# Patient Record
Sex: Female | Born: 1975 | Race: White | Hispanic: Yes | Marital: Single | Smoking: Never smoker
Health system: Southern US, Community
[De-identification: ages and names within clinical notes are randomized; demographics above are authoritative.]

## PROBLEM LIST (undated history)

## (undated) DIAGNOSIS — J45909 Unspecified asthma, uncomplicated: Secondary | ICD-10-CM

---

## 1998-01-24 ENCOUNTER — Emergency Department (HOSPITAL_COMMUNITY): Admission: EM | Admit: 1998-01-24 | Discharge: 1998-01-24 | Payer: Self-pay | Admitting: Emergency Medicine

## 1998-05-06 ENCOUNTER — Encounter: Payer: Self-pay | Admitting: Emergency Medicine

## 1998-05-06 ENCOUNTER — Emergency Department (HOSPITAL_COMMUNITY): Admission: EM | Admit: 1998-05-06 | Discharge: 1998-05-06 | Payer: Self-pay | Admitting: Emergency Medicine

## 1998-10-16 ENCOUNTER — Emergency Department (HOSPITAL_COMMUNITY): Admission: EM | Admit: 1998-10-16 | Discharge: 1998-10-16 | Payer: Self-pay | Admitting: Emergency Medicine

## 1998-10-16 ENCOUNTER — Inpatient Hospital Stay (HOSPITAL_COMMUNITY): Admission: AD | Admit: 1998-10-16 | Discharge: 1998-10-16 | Payer: Self-pay | Admitting: *Deleted

## 1998-11-08 ENCOUNTER — Emergency Department (HOSPITAL_COMMUNITY): Admission: EM | Admit: 1998-11-08 | Discharge: 1998-11-08 | Payer: Self-pay | Admitting: Emergency Medicine

## 1998-11-28 ENCOUNTER — Emergency Department (HOSPITAL_COMMUNITY): Admission: EM | Admit: 1998-11-28 | Discharge: 1998-11-29 | Payer: Self-pay | Admitting: Emergency Medicine

## 1999-04-05 ENCOUNTER — Inpatient Hospital Stay (HOSPITAL_COMMUNITY): Admission: AD | Admit: 1999-04-05 | Discharge: 1999-04-05 | Payer: Self-pay | Admitting: Obstetrics

## 1999-06-08 ENCOUNTER — Emergency Department (HOSPITAL_COMMUNITY): Admission: EM | Admit: 1999-06-08 | Discharge: 1999-06-08 | Payer: Self-pay

## 1999-10-16 ENCOUNTER — Inpatient Hospital Stay (HOSPITAL_COMMUNITY): Admission: AD | Admit: 1999-10-16 | Discharge: 1999-10-16 | Payer: Self-pay | Admitting: Obstetrics

## 1999-10-17 ENCOUNTER — Other Ambulatory Visit: Admission: RE | Admit: 1999-10-17 | Discharge: 1999-10-17 | Payer: Self-pay | Admitting: Obstetrics and Gynecology

## 1999-11-15 ENCOUNTER — Encounter: Payer: Self-pay | Admitting: Obstetrics and Gynecology

## 1999-11-15 ENCOUNTER — Inpatient Hospital Stay (HOSPITAL_COMMUNITY): Admission: AD | Admit: 1999-11-15 | Discharge: 1999-11-15 | Payer: Self-pay | Admitting: Obstetrics and Gynecology

## 2000-01-21 ENCOUNTER — Inpatient Hospital Stay (HOSPITAL_COMMUNITY): Admission: AD | Admit: 2000-01-21 | Discharge: 2000-01-21 | Payer: Self-pay | Admitting: Obstetrics & Gynecology

## 2000-03-03 ENCOUNTER — Inpatient Hospital Stay (HOSPITAL_COMMUNITY): Admission: AD | Admit: 2000-03-03 | Discharge: 2000-03-03 | Payer: Self-pay | Admitting: Obstetrics and Gynecology

## 2000-04-10 ENCOUNTER — Observation Stay (HOSPITAL_COMMUNITY): Admission: AD | Admit: 2000-04-10 | Discharge: 2000-04-11 | Payer: Self-pay | Admitting: Obstetrics and Gynecology

## 2000-04-13 ENCOUNTER — Observation Stay (HOSPITAL_COMMUNITY): Admission: AD | Admit: 2000-04-13 | Discharge: 2000-04-13 | Payer: Self-pay | Admitting: Obstetrics and Gynecology

## 2000-05-08 ENCOUNTER — Inpatient Hospital Stay: Admission: AD | Admit: 2000-05-08 | Discharge: 2000-05-08 | Payer: Self-pay | Admitting: Obstetrics & Gynecology

## 2000-06-17 ENCOUNTER — Inpatient Hospital Stay (HOSPITAL_COMMUNITY): Admission: AD | Admit: 2000-06-17 | Discharge: 2000-06-21 | Payer: Self-pay | Admitting: Obstetrics & Gynecology

## 2000-06-27 ENCOUNTER — Inpatient Hospital Stay (HOSPITAL_COMMUNITY): Admission: AD | Admit: 2000-06-27 | Discharge: 2000-06-30 | Payer: Self-pay | Admitting: Obstetrics and Gynecology

## 2000-09-13 ENCOUNTER — Emergency Department (HOSPITAL_COMMUNITY): Admission: EM | Admit: 2000-09-13 | Discharge: 2000-09-13 | Payer: Self-pay | Admitting: Emergency Medicine

## 2000-09-19 ENCOUNTER — Emergency Department (HOSPITAL_COMMUNITY): Admission: EM | Admit: 2000-09-19 | Discharge: 2000-09-20 | Payer: Self-pay | Admitting: Emergency Medicine

## 2001-02-14 ENCOUNTER — Inpatient Hospital Stay (HOSPITAL_COMMUNITY): Admission: AD | Admit: 2001-02-14 | Discharge: 2001-02-14 | Payer: Self-pay | Admitting: Obstetrics & Gynecology

## 2001-02-18 ENCOUNTER — Encounter: Payer: Self-pay | Admitting: *Deleted

## 2001-02-18 ENCOUNTER — Inpatient Hospital Stay (HOSPITAL_COMMUNITY): Admission: AD | Admit: 2001-02-18 | Discharge: 2001-02-18 | Payer: Self-pay | Admitting: *Deleted

## 2001-03-10 ENCOUNTER — Inpatient Hospital Stay (HOSPITAL_COMMUNITY): Admission: AD | Admit: 2001-03-10 | Discharge: 2001-03-10 | Payer: Self-pay | Admitting: Obstetrics

## 2001-03-17 ENCOUNTER — Inpatient Hospital Stay (HOSPITAL_COMMUNITY): Admission: AD | Admit: 2001-03-17 | Discharge: 2001-03-17 | Payer: Self-pay | Admitting: Obstetrics

## 2001-04-20 ENCOUNTER — Emergency Department (HOSPITAL_COMMUNITY): Admission: EM | Admit: 2001-04-20 | Discharge: 2001-04-21 | Payer: Self-pay | Admitting: Emergency Medicine

## 2001-05-23 ENCOUNTER — Emergency Department (HOSPITAL_COMMUNITY): Admission: EM | Admit: 2001-05-23 | Discharge: 2001-05-23 | Payer: Self-pay | Admitting: Emergency Medicine

## 2001-05-23 ENCOUNTER — Encounter: Payer: Self-pay | Admitting: Emergency Medicine

## 2001-08-28 ENCOUNTER — Emergency Department (HOSPITAL_COMMUNITY): Admission: EM | Admit: 2001-08-28 | Discharge: 2001-08-28 | Payer: Self-pay | Admitting: Emergency Medicine

## 2002-01-06 ENCOUNTER — Emergency Department (HOSPITAL_COMMUNITY): Admission: EM | Admit: 2002-01-06 | Discharge: 2002-01-06 | Payer: Self-pay | Admitting: Emergency Medicine

## 2002-03-14 ENCOUNTER — Inpatient Hospital Stay (HOSPITAL_COMMUNITY): Admission: AD | Admit: 2002-03-14 | Discharge: 2002-03-14 | Payer: Self-pay | Admitting: *Deleted

## 2002-05-14 ENCOUNTER — Ambulatory Visit (HOSPITAL_COMMUNITY): Admission: RE | Admit: 2002-05-14 | Discharge: 2002-05-14 | Payer: Self-pay | Admitting: *Deleted

## 2002-08-21 ENCOUNTER — Inpatient Hospital Stay (HOSPITAL_COMMUNITY): Admission: AD | Admit: 2002-08-21 | Discharge: 2002-08-21 | Payer: Self-pay | Admitting: *Deleted

## 2002-12-18 ENCOUNTER — Emergency Department (HOSPITAL_COMMUNITY): Admission: EM | Admit: 2002-12-18 | Discharge: 2002-12-18 | Payer: Self-pay | Admitting: Emergency Medicine

## 2002-12-18 ENCOUNTER — Encounter: Payer: Self-pay | Admitting: Emergency Medicine

## 2003-07-30 ENCOUNTER — Other Ambulatory Visit: Admission: RE | Admit: 2003-07-30 | Discharge: 2003-07-30 | Payer: Self-pay | Admitting: Obstetrics and Gynecology

## 2005-06-08 ENCOUNTER — Emergency Department (HOSPITAL_COMMUNITY): Admission: EM | Admit: 2005-06-08 | Discharge: 2005-06-08 | Payer: Self-pay | Admitting: Family Medicine

## 2012-08-19 ENCOUNTER — Encounter (HOSPITAL_COMMUNITY): Payer: Self-pay | Admitting: Emergency Medicine

## 2012-08-19 ENCOUNTER — Emergency Department (HOSPITAL_COMMUNITY): Payer: Medicaid Other

## 2012-08-19 ENCOUNTER — Emergency Department (HOSPITAL_COMMUNITY)
Admission: EM | Admit: 2012-08-19 | Discharge: 2012-08-20 | Disposition: A | Discharge status: Home / Self Care | Payer: Medicaid Other | Attending: Emergency Medicine | Admitting: Emergency Medicine

## 2012-08-19 DIAGNOSIS — Z79899 Other long term (current) drug therapy: Secondary | ICD-10-CM | POA: Insufficient documentation

## 2012-08-19 DIAGNOSIS — R0982 Postnasal drip: Secondary | ICD-10-CM | POA: Insufficient documentation

## 2012-08-19 DIAGNOSIS — J029 Acute pharyngitis, unspecified: Secondary | ICD-10-CM | POA: Insufficient documentation

## 2012-08-19 DIAGNOSIS — J3489 Other specified disorders of nose and nasal sinuses: Secondary | ICD-10-CM | POA: Insufficient documentation

## 2012-08-19 DIAGNOSIS — J069 Acute upper respiratory infection, unspecified: Secondary | ICD-10-CM | POA: Insufficient documentation

## 2012-08-19 DIAGNOSIS — R0602 Shortness of breath: Secondary | ICD-10-CM | POA: Insufficient documentation

## 2012-08-19 DIAGNOSIS — J45901 Unspecified asthma with (acute) exacerbation: Secondary | ICD-10-CM | POA: Insufficient documentation

## 2012-08-19 HISTORY — DX: Unspecified asthma, uncomplicated: J45.909

## 2012-08-19 MED ORDER — IPRATROPIUM BROMIDE 0.02 % IN SOLN
0.5000 mg | Freq: Once | RESPIRATORY_TRACT | Status: AC
  Administered 2012-08-19: 0.5 mg via RESPIRATORY_TRACT
  Filled 2012-08-19: qty 2.5

## 2012-08-19 MED ORDER — ALBUTEROL SULFATE (5 MG/ML) 0.5% IN NEBU
5.0000 mg | INHALATION_SOLUTION | Freq: Once | RESPIRATORY_TRACT | Status: AC
  Administered 2012-08-19: 5 mg via RESPIRATORY_TRACT
  Filled 2012-08-19: qty 1

## 2012-08-19 MED ORDER — PREDNISONE 20 MG PO TABS
60.0000 mg | ORAL_TABLET | Freq: Once | ORAL | Status: AC
  Administered 2012-08-19: 60 mg via ORAL
  Filled 2012-08-19: qty 3

## 2012-08-19 NOTE — ED Notes (Signed)
Pt alert, arrives from home, c/o asthma, onset over past several days, developing URI, resp even unlabored, skin pwd, mild exp wheeze noted

## 2012-08-19 NOTE — ED Notes (Signed)
Pt ambulatory to exam room with steady gait. Pt states she has had a productive cough with green sputum. Pt also states she has ear and throat pain when she breathes. Pt with no acute distress. States she last used her inhaler last night.

## 2012-08-19 NOTE — ED Provider Notes (Signed)
History    This chart was scribed for non-physician practitioner working with Rachel Munch, MD by [name], ED Scribe. This patient was seen in room WTR7/WTR7 and the patient's care was started at 10:18PM.   CSN: [id number]  Arrival date & time 08/19/12  2013   First MD Initiated Contact with Patient 08/19/12 2218      Chief Complaint  Patient presents with  . Asthma    (Consider location/radiation/quality/duration/timing/severity/associated sxs/prior treatment) The history is provided by the patient. No language interpreter was used.    Rachel Soto is a 37 y.o. female , with a hx of pneumonia (last episode occurred in 2001) and asthma, who presents to the Emergency Department with a chief complaint of asthma complaining of gradual, progressively worsening, asthma, onset three days ago (08/16/12)  Associated symptoms include sinus pain/pressure and wheezing. The pt reports she has been experiencing difficulty breathing and chest pressure, similar towards that experienced with prior asthma attacks, for the past three days. The pt has taken robitussin cough syrup and applied her albuterol inhaler, however, she informs neither of which provide relief of her asthma like symptoms.   The pt denies any other medical problems at this point and time.   The pt does not smoke or drink alcohol.   Pt does not have a PCP.     Past Medical History  Diagnosis Date  . Asthma     Past Surgical History  Procedure Laterality Date  . Cesarean section      No family history on file.  History  Substance Use Topics  . Smoking status: Never Smoker   . Smokeless tobacco: Not on file  . Alcohol Use: No    OB History   Grav Para Term Preterm Abortions TAB SAB Ect Mult Living                  Review of Systems  Constitutional: Negative for fever, chills, diaphoresis, appetite change, fatigue and unexpected weight change.  HENT: Positive for congestion, sore throat, rhinorrhea, postnasal  drip and sinus pressure. Negative for ear pain, mouth sores, neck stiffness and ear discharge.   Eyes: Negative for visual disturbance.  Respiratory: Positive for cough, chest tightness and wheezing. Negative for shortness of breath and stridor.   Cardiovascular: Negative for chest pain, palpitations and leg swelling.  Gastrointestinal: Negative for nausea, vomiting, abdominal pain, diarrhea and constipation.  Endocrine: Negative for polydipsia, polyphagia and polyuria.  Genitourinary: Negative for dysuria, urgency, frequency and hematuria.  Musculoskeletal: Negative for myalgias, back pain and arthralgias.  Skin: Negative for rash.  Allergic/Immunologic: Negative for immunocompromised state.  Neurological: Negative for syncope, light-headedness, numbness and headaches.  Hematological: Negative for adenopathy. Does not bruise/bleed easily.  Psychiatric/Behavioral: Negative for sleep disturbance. The patient is not nervous/anxious.   All other systems reviewed and are negative.    Allergies  Review of patient's allergies indicates no known allergies.  Home Medications   Current Outpatient Rx  Name  Route  Sig  Dispense  Refill  . albuterol (PROVENTIL HFA;VENTOLIN HFA) 108 (90 BASE) MCG/ACT inhaler   Inhalation   Inhale 2 puffs into the lungs every 6 (six) hours as needed for wheezing.         Marland Kitchen guaiFENesin (ROBITUSSIN) 100 MG/5ML SOLN   Oral   Take 30 mLs by mouth every 6 (six) hours as needed (for cough.).         Marland Kitchen azithromycin (ZITHROMAX) 250 MG tablet   Oral   Take 1  tablet (250 mg total) by mouth daily. Take first 2 tablets together, then 1 every day until finished.   6 tablet   0   . guaiFENesin (MUCINEX) 600 MG 12 hr tablet   Oral   Take 2 tablets (1,200 mg total) by mouth 2 (two) times daily.   20 tablet   0   . HYDROcodone-homatropine (HYCODAN) 5-1.5 MG/5ML syrup   Oral   Take 5 mLs by mouth every 6 (six) hours as needed for cough.   120 mL   0   .  ipratropium (ATROVENT) 0.03 % nasal spray   Nasal   Place 2 sprays into the nose 2 (two) times daily. PRN congestion   30 mL   0   . predniSONE (DELTASONE) 20 MG tablet   Oral   Take 2 tablets (40 mg total) by mouth daily.   10 tablet   0     BP 124/72  Pulse 83  Temp(Src) 98 F (36.7 C) (Oral)  Resp 16  Wt 265 lb (120.203 kg)  SpO2 100%  LMP 08/08/2012  Physical Exam  Nursing note and vitals reviewed. Constitutional: She is oriented to person, place, and time. She appears well-developed and well-nourished. No distress.  HENT:  Head: Normocephalic and atraumatic.  Right Ear: Tympanic membrane, external ear and ear canal normal.  Left Ear: Tympanic membrane, external ear and ear canal normal.  Nose: Mucosal edema and rhinorrhea present. No epistaxis. Right sinus exhibits no maxillary sinus tenderness and no frontal sinus tenderness. Left sinus exhibits no maxillary sinus tenderness and no frontal sinus tenderness.  Mouth/Throat: Uvula is midline, oropharynx is clear and moist and mucous membranes are normal. Mucous membranes are not pale and not cyanotic. No oropharyngeal exudate, posterior oropharyngeal edema, posterior oropharyngeal erythema or tonsillar abscesses.  Eyes: Conjunctivae and EOM are normal. Pupils are equal, round, and reactive to light. No scleral icterus.  Neck: Normal range of motion and full passive range of motion without pain. Neck supple.  Cardiovascular: Normal rate, regular rhythm, normal heart sounds and intact distal pulses.  Exam reveals no gallop and no friction rub.   No murmur heard. Pulmonary/Chest: Effort normal and breath sounds normal. No stridor. No respiratory distress. She has no wheezes. She has no rales.  Lung sounds diminished throughout.   Abdominal: Soft. Bowel sounds are normal. She exhibits no mass. There is no tenderness. There is no rebound and no guarding.  Musculoskeletal: Normal range of motion. She exhibits no edema and no  tenderness.  Lymphadenopathy:    She has no cervical adenopathy.  Neurological: She is alert and oriented to person, place, and time. She exhibits normal muscle tone. Coordination normal.  Speech is clear and goal oriented Moves extremities without ataxia  Skin: Skin is warm and dry. No rash noted. She is not diaphoretic. No erythema.  Psychiatric: She has a normal mood and affect.    ED Course  Procedures (including critical care time)  DIAGNOSTIC STUDIES: Oxygen Saturation is 100% on room air, normal by my interpretation.    COORDINATION OF CARE:   11:07 PM- Treatment plan concerning application of breathing treatment discussed with patient. Pt agrees with treatment.     Labs Reviewed - No data to display  No results found for this or any previous visit. Dg Chest 2 View  08/19/2012  *RADIOLOGY REPORT*  Clinical Data: Shortness of breath  CHEST - 2 VIEW  Comparison: None  Findings: The cardiomediastinal silhouette is unremarkable. The lungs are clear.  There is no evidence of focal airspace disease, pulmonary edema, suspicious pulmonary nodule/mass, pleural effusion, or pneumothorax. No acute bony abnormalities are identified.  IMPRESSION: No evidence of acute cardiopulmonary disease.   Original Report Authenticated By: Harmon Pier, M.D.       1. URI (upper respiratory infection)   2. Asthma exacerbation       MDM  Afua Hoots presents with URI symptoms and asthma exacerbation.  Pt CXR negative for acute infiltrate. Patients symptoms are consistent with URI, likely viral etiology. Discussed that antibiotics are not indicated for viral infections. Pt also with asthma exacerbation.  Patient ambulated in ED with O2 saturations maintained >90, no current signs of respiratory distress. Lung exam improved after nebulizer treatment. Prednisone given in the ED and pt will bd dc with 5 day burst. Pt states they are breathing at baseline. Pt has been instructed to continue using  prescribed medications.  Pt will be discharged with symptomatic treatment.  Verbalizes understanding and is agreeable with plan. Pt is hemodynamically stable & in NAD prior to dc.   1. Medications: atrovent NS, mucinex, hycodan, azithromycin, prednisone, usual home medications 2. Treatment: rest, drink plenty of fluids, take tylenol or ibuprofen for fever control 3. Follow Up: Please followup with your primary doctor for discussion of your diagnoses and further evaluation after today's visit; if you do not have a primary care doctor use the resource guide provided to find one;    I personally performed the services described in this documentation, which was scribed in my presence. The recorded information has been reviewed and is accurate.   Dahlia Client Davontay Watlington, PA-C 08/20/12 0009

## 2012-08-20 MED ORDER — ALBUTEROL SULFATE HFA 108 (90 BASE) MCG/ACT IN AERS: 2.0000 | INHALATION_SPRAY | RESPIRATORY_TRACT | Status: DC | PRN

## 2012-08-20 MED ORDER — IPRATROPIUM BROMIDE 0.03 % NA SOLN: 2.0000 | Freq: Two times a day (BID) | NASAL | Status: DC

## 2012-08-20 MED ORDER — HYDROCODONE-HOMATROPINE 5-1.5 MG/5ML PO SYRP: 5.0000 mL | ORAL_SOLUTION | Freq: Four times a day (QID) | ORAL | Status: DC | PRN

## 2012-08-20 MED ORDER — AZITHROMYCIN 250 MG PO TABS: 250.0000 mg | ORAL_TABLET | Freq: Every day | ORAL | Status: DC

## 2012-08-20 MED ORDER — PREDNISONE 20 MG PO TABS: 40.0000 mg | ORAL_TABLET | Freq: Every day | ORAL | Status: DC

## 2012-08-20 MED ORDER — GUAIFENESIN ER 600 MG PO TB12: 1200.0000 mg | ORAL_TABLET | Freq: Two times a day (BID) | ORAL | Status: DC

## 2012-08-20 NOTE — ED Provider Notes (Signed)
  Medical screening examination/treatment/procedure(s) were performed by non-physician practitioner and as supervising physician I was immediately available for consultation/collaboration.    [name], MD 08/20/12 [phone]

## 2013-10-26 ENCOUNTER — Emergency Department (HOSPITAL_COMMUNITY): Payer: BC Managed Care – PPO

## 2013-10-26 ENCOUNTER — Emergency Department (HOSPITAL_COMMUNITY)
Admission: EM | Admit: 2013-10-26 | Discharge: 2013-10-27 | Disposition: A | Discharge status: Home / Self Care | Payer: BC Managed Care – PPO | Attending: Emergency Medicine | Admitting: Emergency Medicine

## 2013-10-26 ENCOUNTER — Encounter (HOSPITAL_COMMUNITY): Payer: Self-pay | Admitting: Emergency Medicine

## 2013-10-26 DIAGNOSIS — R0789 Other chest pain: Secondary | ICD-10-CM | POA: Insufficient documentation

## 2013-10-26 DIAGNOSIS — R079 Chest pain, unspecified: Secondary | ICD-10-CM

## 2013-10-26 DIAGNOSIS — J45909 Unspecified asthma, uncomplicated: Secondary | ICD-10-CM | POA: Insufficient documentation

## 2013-10-26 LAB — I-STAT TROPONIN, ED: TROPONIN I, POC: 0 ng/mL (ref 0.00–0.08)

## 2013-10-26 MED ORDER — IBUPROFEN 600 MG PO TABS: 600.0000 mg | ORAL_TABLET | Freq: Three times a day (TID) | ORAL | Status: AC

## 2013-10-26 MED ORDER — KETOROLAC TROMETHAMINE 30 MG/ML IJ SOLN
30.0000 mg | Freq: Once | INTRAMUSCULAR | Status: AC
  Administered 2013-10-26: 30 mg via INTRAVENOUS
  Filled 2013-10-26: qty 1

## 2013-10-26 NOTE — ED Provider Notes (Signed)
CSN: [id number]     Arrival date & time 10/26/13  2028 History   First MD Initiated Contact with Patient 10/26/13 2213     Chief Complaint  Patient presents with  . Chest Pain      HPI  Patient presents with ongoing chest pain.  Pain began several months ago, has been inconsistent, waxing/waning.  There are no clear precipitating or relieving, or exacerbating factors. Today the patient a more severe episode of substernal discomfort, described as soreness. She is here for evaluation. Patient has not taken any medication for pain relief. She denies concurrent dyspnea, lightheadedness, syncope, nausea, vomiting, fever, chills. Patient does not smoke, does not drink, has no travel history. The patient works in a [employer], [employer], and moves tires regularly.  Past Medical History  Diagnosis Date  . Asthma    Past Surgical History  Procedure Laterality Date  . Cesarean section     History reviewed. No pertinent family history. History  Substance Use Topics  . Smoking status: Never Smoker   . Smokeless tobacco: Not on file  . Alcohol Use: No   OB History   Grav Para Term Preterm Abortions TAB SAB Ect Mult Living                 Review of Systems  Constitutional:       Per HPI, otherwise negative  HENT:       Per HPI, otherwise negative  Respiratory:       Per HPI, otherwise negative  Cardiovascular:       Per HPI, otherwise negative  Gastrointestinal: Negative for vomiting.  Endocrine:       Negative aside from HPI  Genitourinary:       Neg aside from HPI   Musculoskeletal:       Per HPI, otherwise negative  Skin: Negative.   Neurological: Negative for syncope.      Allergies  Review of patient's allergies indicates no known allergies.  Home Medications   Prior to Admission medications   Not on File   BP 128/59  Pulse 88  Temp(Src) 98 F (36.7 C) (Oral)  Resp 16  SpO2 98%  LMP 10/13/2013 Physical Exam  Nursing note and vitals reviewed. Constitutional:  She is oriented to person, place, and time. She appears well-developed and well-nourished. No distress.  HENT:  Head: Normocephalic and atraumatic.  Eyes: Conjunctivae and EOM are normal.  Cardiovascular: Normal rate and regular rhythm.   Pulmonary/Chest: Effort normal and breath sounds normal. No stridor. No respiratory distress.  Abdominal: She exhibits no distension.  Musculoskeletal: She exhibits no edema.  Neurological: She is alert and oriented to person, place, and time. No cranial nerve deficit.  Skin: Skin is warm and dry.  Psychiatric: She has a normal mood and affect.    ED Course  Procedures (including critical care time) Labs Review Labs Reviewed  I-STAT TROPOININ, ED   CXR reviewed   EKG sinus rhythm, 84, nml   On repeat exam the patient is in no distress.  MDM   Female presents with persistent chest pain, worse today. On exam she is awake alert, and in no distress. She is hemodynamically stable, awake, with no evidence of atypical ACS or occult infection. With the persistency of her pain she started on anti-inflammatories, discharged to follow up with primary care.    Gerhard Munchobert Ayssa Bentivegna, MD 10/26/13 [phone]

## 2013-10-26 NOTE — ED Notes (Signed)
Pt reports intermittent cp x 2 months, denies seeing a doctor d/t work.  Pt reports came to the ED tonight d/t cp becoming severe.

## 2013-10-26 NOTE — ED Notes (Signed)
Pt complains of chest pain for two months but was worse today with a headache and sharp pains

## 2013-10-26 NOTE — Discharge Instructions (Signed)
As discussed, your evaluation today has been largely reassuring.  But, it is important that you monitor your condition carefully, and do not hesitate to return to the ED if you develop new, or concerning changes in your condition. ° °Otherwise, please follow-up with your physician for appropriate ongoing care. ° ° °Chest Pain (Nonspecific) °It is often hard to give a specific diagnosis for the cause of chest pain. There is always a chance that your pain could be related to something serious, such as a heart attack or a blood clot in the lungs. You need to follow up with your caregiver for further evaluation. °CAUSES  °· Heartburn. °· Pneumonia or bronchitis. °· Anxiety or stress. °· Inflammation around your heart (pericarditis) or lung (pleuritis or pleurisy). °· A blood clot in the lung. °· A collapsed lung (pneumothorax). It can develop suddenly on its own (spontaneous pneumothorax) or from injury (trauma) to the chest. °· Shingles infection (herpes zoster virus). °The chest wall is composed of bones, muscles, and cartilage. Any of these can be the source of the pain. °· The bones can be bruised by injury. °· The muscles or cartilage can be strained by coughing or overwork. °· The cartilage can be affected by inflammation and become sore (costochondritis). °DIAGNOSIS  °Lab tests or other studies, such as X-rays, electrocardiography, stress testing, or cardiac imaging, may be needed to find the cause of your pain.  °TREATMENT  °· Treatment depends on what may be causing your chest pain. Treatment may include: °· Acid blockers for heartburn. °· Anti-inflammatory medicine. °· Pain medicine for inflammatory conditions. °· Antibiotics if an infection is present. °· You may be advised to change lifestyle habits. This includes stopping smoking and avoiding alcohol, caffeine, and chocolate. °· You may be advised to keep your head raised (elevated) when sleeping. This reduces the chance of acid going backward from your  stomach into your esophagus. °· Most of the time, nonspecific chest pain will improve within 2 to 3 days with rest and mild pain medicine. °HOME CARE INSTRUCTIONS  °· If antibiotics were prescribed, take your antibiotics as directed. Finish them even if you start to feel better. °· For the next few days, avoid physical activities that bring on chest pain. Continue physical activities as directed. °· Do not smoke. °· Avoid drinking alcohol. °· Only take over-the-counter or prescription medicine for pain, discomfort, or fever as directed by your caregiver. °· Follow your caregiver's suggestions for further testing if your chest pain does not go away. °· Keep any follow-up appointments you made. If you do not go to an appointment, you could develop lasting (chronic) problems with pain. If there is any problem keeping an appointment, you must call to reschedule. °SEEK MEDICAL CARE IF:  °· You think you are having problems from the medicine you are taking. Read your medicine instructions carefully. °· Your chest pain does not go away, even after treatment. °· You develop a rash with blisters on your chest. °SEEK IMMEDIATE MEDICAL CARE IF:  °· You have increased chest pain or pain that spreads to your arm, neck, jaw, back, or abdomen. °· You develop shortness of breath, an increasing cough, or you are coughing up blood. °· You have severe back or abdominal pain, feel nauseous, or vomit. °· You develop severe weakness, fainting, or chills. °· You have a fever. °THIS IS AN EMERGENCY. Do not wait to see if the pain will go away. Get medical help at once. Call your local emergency services (  911 in [place] Do not drive yourself to the hospital. °MAKE SURE YOU:  °· Understand these instructions. °· Will watch your condition. °· Will get help right away if you are not doing well or get worse. °Document Released: 03/21/2005 Document Revised: 09/03/2011 Document Reviewed: 01/15/2008 °Exit[employer]® Patient Information ©2014 [employer]  [employer]. ° ° °

## 2016-07-18 ENCOUNTER — Emergency Department (HOSPITAL_COMMUNITY)
Admission: EM | Admit: 2016-07-18 | Discharge: 2016-07-18 | Disposition: A | Discharge status: Home / Self Care | Payer: BLUE CROSS/BLUE SHIELD | Attending: Emergency Medicine | Admitting: Emergency Medicine

## 2016-07-18 ENCOUNTER — Encounter (HOSPITAL_COMMUNITY): Payer: Self-pay

## 2016-07-18 DIAGNOSIS — J45909 Unspecified asthma, uncomplicated: Secondary | ICD-10-CM | POA: Diagnosis not present

## 2016-07-18 DIAGNOSIS — J111 Influenza due to unidentified influenza virus with other respiratory manifestations: Secondary | ICD-10-CM

## 2016-07-18 DIAGNOSIS — R05 Cough: Secondary | ICD-10-CM | POA: Diagnosis present

## 2016-07-18 DIAGNOSIS — R69 Illness, unspecified: Secondary | ICD-10-CM

## 2016-07-18 MED ORDER — OSELTAMIVIR PHOSPHATE 75 MG PO CAPS: 75.0000 mg | ORAL_CAPSULE | Freq: Two times a day (BID) | ORAL | 0 refills | Status: DC

## 2016-07-18 MED ORDER — HYDROCOD POLST-CPM POLST ER 10-8 MG/5ML PO SUER: 5.0000 mL | Freq: Two times a day (BID) | ORAL | 0 refills | Status: DC | PRN

## 2016-07-18 MED ORDER — ALBUTEROL SULFATE HFA 108 (90 BASE) MCG/ACT IN AERS
2.0000 | INHALATION_SPRAY | Freq: Once | RESPIRATORY_TRACT | Status: DC
  Filled 2016-07-18: qty 6.7

## 2016-07-18 NOTE — Discharge Instructions (Signed)
Read the information below.  Use the prescribed medication as directed.  Please discuss all new medications with your pharmacist.  You may return to the Emergency Department at any time for worsening condition or any new symptoms that concern you.   If you develop worsening shortness of breath, uncontrolled wheezing, severe chest pain, or fevers despite using tylenol and/or ibuprofen, return for a recheck.     °

## 2016-07-18 NOTE — ED Provider Notes (Signed)
[facility]-EMERGENCY DEPT Provider Note   CSN: [id number] Arrival date & time: 07/18/16  1509  By signing my name below, I, Teofilo PodMatthew P. Jamison, attest that this documentation has been prepared under the direction and in the presence of Chanse Kagel, [place]. Electronically Signed: Teofilo PodMatthew P. Jamison, ED Scribe. 07/18/2016. 5:46 PM.    History   Chief Complaint Chief Complaint  Patient presents with  . flu symptoms   The history is provided by the patient. No language interpreter was used.   HPI Comments:  Rachel Soto is a 41 y.o. female with PMHx of asthma who presents to the Emergency Department complaining of multiple sudden onset flu symptoms that began last night. Pt complains of associated dizziness, persistent productive cough with yellow sputum, sore throat, fever of 103 at home, back pain, diaphoresis, decreased appetite, and lightheadedness. Pt states that these symptoms began last night when she got home from work. Pt took robitussin, vitamin c, and advil with no relief. Pt got a flu shot this year. Pt denies SOB, abdominal pain.   Past Medical History:  Diagnosis Date  . Asthma     There are no active problems to display for this patient.   Past Surgical History:  Procedure Laterality Date  . CESAREAN SECTION      OB History    No data available       Home Medications    Prior to Admission medications   Medication Sig Start Date End Date Taking? Authorizing Provider  chlorpheniramine-HYDROcodone (TUSSIONEX PENNKINETIC ER) 10-8 MG/5ML SUER Take 5 mLs by mouth every 12 (twelve) hours as needed for cough. 07/18/16   Trixie DredgeEmily Calvin Chura, PA-C  oseltamivir (TAMIFLU) 75 MG capsule Take 1 capsule (75 mg total) by mouth every 12 (twelve) hours. 07/18/16   Trixie DredgeEmily Rosalio Catterton, PA-C    Family History Family History  Problem Relation Age of Onset  . Diabetes Mother   . Fibromyalgia Mother   . Hepatitis C Mother     Social History Social History  Substance Use Topics  . Smoking  status: Never Smoker  . Smokeless tobacco: Never Used  . Alcohol use No     Allergies   Patient has no known allergies.   Review of Systems Review of Systems  Constitutional: Positive for appetite change, diaphoresis and fever.  HENT: Positive for sore throat.   Respiratory: Positive for cough. Negative for shortness of breath.   Gastrointestinal: Negative for abdominal pain.  Neurological: Positive for dizziness and light-headedness.     Physical Exam Updated Vital Signs BP 140/79 (BP Location: Right Arm)   Pulse 99   Temp 98.6 F (37 C) (Oral)   Resp 18   Ht 5\' 3"  (1.6 m)   Wt 128.8 kg   LMP 06/26/2016 (Approximate)   SpO2 98%   BMI 50.31 kg/m   Physical Exam  Constitutional: She appears well-developed and well-nourished. No distress.  HENT:  Head: Normocephalic and atraumatic.  Mouth/Throat: Oropharynx is clear and moist. No oropharyngeal exudate, posterior oropharyngeal edema, posterior oropharyngeal erythema or tonsillar abscesses.  Nasal mucosa erythematous and edematous.   Eyes: Conjunctivae are normal.  Neck: Normal range of motion. Neck supple.  Cardiovascular: Normal rate and regular rhythm.   Pulmonary/Chest: Effort normal and breath sounds normal. No stridor. No respiratory distress. She has no wheezes. She has no rales.  Lymphadenopathy:    She has no cervical adenopathy.  Neurological: She is alert.  Skin: She is not diaphoretic.  Nursing note and vitals reviewed.  ED Treatments / Results  DIAGNOSTIC STUDIES:  Oxygen Saturation is 99% on RA, normal by my interpretation.    COORDINATION OF CARE:  5:43 PM Will order cough medicine, tamiflu, and inhaler. Discussed treatment plan with pt at bedside and pt agreed to plan.   Labs (all labs ordered are listed, but only abnormal results are displayed) Labs Reviewed - No data to display  EKG  EKG Interpretation None       Radiology No results found.  Procedures Procedures (including  critical care time)  Medications Ordered in ED Medications  albuterol (PROVENTIL HFA;VENTOLIN HFA) 108 (90 Base) MCG/ACT inhaler 2 puff (2 puffs Inhalation Not Given 07/18/16 1813)     Initial Impression / Assessment and Plan / ED Course  I have reviewed the triage vital signs and the nursing notes.  Pertinent labs & imaging results that were available during my care of the patient were reviewed by me and considered in my medical decision making (see chart for details).    Afebrile, nontoxic patient with constellation of symptoms suggestive of viral syndrome, likely influenza.  No concerning findings on exam.  Discharged home with supportive care, PCP follow up.  Discussed result, findings, treatment, and follow up  with patient.  Pt given return precautions.  Pt verbalizes understanding and agrees with plan.      Final Clinical Impressions(s) / ED Diagnoses   Final diagnoses:  Influenza-like illness    New Prescriptions Discharge Medication List as of 07/18/2016  5:48 PM    START taking these medications   Details  chlorpheniramine-HYDROcodone (TUSSIONEX PENNKINETIC ER) 10-8 MG/5ML SUER Take 5 mLs by mouth every 12 (twelve) hours as needed for cough., Starting Wed 07/18/2016, Print    oseltamivir (TAMIFLU) 75 MG capsule Take 1 capsule (75 mg total) by mouth every 12 (twelve) hours., Starting Wed 07/18/2016, Print        I personally performed the services described in this documentation, which was scribed in my presence. The recorded information has been reviewed and is accurate.    Trixie Dredge, PA-C 07/18/16 2029    Benjiman Core, MD 07/19/16 Moses Manners

## 2016-07-18 NOTE — ED Triage Notes (Signed)
Patient reports flu-like symptoms since last night. Patient reports fever yesterday of 103.0, coughing, body aches, headache. Patient has been taking OTC meds- Robitussin, Tylenol and Ibuprofen.

## 2017-02-04 ENCOUNTER — Emergency Department (HOSPITAL_COMMUNITY)
Admission: EM | Admit: 2017-02-04 | Discharge: 2017-02-04 | Disposition: A | Discharge status: Home / Self Care | Payer: BLUE CROSS/BLUE SHIELD | Attending: Emergency Medicine | Admitting: Emergency Medicine

## 2017-02-04 ENCOUNTER — Encounter (HOSPITAL_COMMUNITY): Payer: Self-pay | Admitting: Emergency Medicine

## 2017-02-04 DIAGNOSIS — R51 Headache: Secondary | ICD-10-CM | POA: Diagnosis not present

## 2017-02-04 DIAGNOSIS — R110 Nausea: Secondary | ICD-10-CM | POA: Insufficient documentation

## 2017-02-04 LAB — URINALYSIS, ROUTINE W REFLEX MICROSCOPIC
BACTERIA UA: NONE SEEN
BILIRUBIN URINE: NEGATIVE
Glucose, UA: NEGATIVE mg/dL
Hgb urine dipstick: NEGATIVE
Ketones, ur: NEGATIVE mg/dL
Nitrite: NEGATIVE
PROTEIN: NEGATIVE mg/dL
Specific Gravity, Urine: 1.016 (ref 1.005–1.030)
pH: 8 (ref 5.0–8.0)

## 2017-02-04 LAB — POC URINE PREG, ED: Preg Test, Ur: NEGATIVE

## 2017-02-04 MED ORDER — ONDANSETRON 4 MG PO TBDP
4.0000 mg | ORAL_TABLET | Freq: Once | ORAL | Status: AC | PRN
  Administered 2017-02-04: 4 mg via ORAL
  Filled 2017-02-04: qty 1

## 2017-02-04 NOTE — ED Notes (Signed)
No answer when called for room x 3 

## 2017-02-04 NOTE — ED Triage Notes (Signed)
Pt reports headache for the last 4 days along with nausea.

## 2017-05-07 ENCOUNTER — Ambulatory Visit: Payer: Self-pay | Admitting: Physician Assistant

## 2018-01-25 ENCOUNTER — Other Ambulatory Visit: Payer: Self-pay

## 2018-01-25 ENCOUNTER — Encounter (HOSPITAL_COMMUNITY): Payer: Self-pay | Admitting: *Deleted

## 2018-01-25 ENCOUNTER — Emergency Department (HOSPITAL_COMMUNITY)
Admission: EM | Admit: 2018-01-25 | Discharge: 2018-01-25 | Disposition: A | Discharge status: Home / Self Care | Payer: BLUE CROSS/BLUE SHIELD | Attending: Emergency Medicine | Admitting: Emergency Medicine

## 2018-01-25 DIAGNOSIS — Z5321 Procedure and treatment not carried out due to patient leaving prior to being seen by health care provider: Secondary | ICD-10-CM | POA: Diagnosis not present

## 2018-01-25 DIAGNOSIS — M545 Low back pain: Secondary | ICD-10-CM | POA: Insufficient documentation

## 2018-01-25 NOTE — ED Triage Notes (Signed)
Pt reports she was at work went to pick up a tire off the ground and she felt a pain in the left lower back area on the 29. Last took 5 advil for the pain this morning.  She denies numbness in extremities. Ambulatory to waiting area with her family.

## 2018-01-25 NOTE — ED Notes (Signed)
After triage, patient says that she can't go to the lobby because she is unable to sit or lie down for long periods of time. I offered the patient warm packs, cool compresses and a wheelchair. Refused the above. Ambulatory to lobby with her female partner.

## 2018-01-27 NOTE — ED Notes (Signed)
Follow up call made  No answer  01/27/18 0858  s Terrill Wauters rn

## 2019-08-11 ENCOUNTER — Other Ambulatory Visit: Payer: Self-pay

## 2019-08-11 ENCOUNTER — Encounter (HOSPITAL_BASED_OUTPATIENT_CLINIC_OR_DEPARTMENT_OTHER): Payer: Self-pay | Admitting: Emergency Medicine

## 2019-08-11 ENCOUNTER — Emergency Department (HOSPITAL_BASED_OUTPATIENT_CLINIC_OR_DEPARTMENT_OTHER)
Admission: EM | Admit: 2019-08-11 | Discharge: 2019-08-12 | Disposition: A | Discharge status: Home / Self Care | Payer: BC Managed Care – PPO | Attending: Emergency Medicine | Admitting: Emergency Medicine

## 2019-08-11 DIAGNOSIS — R197 Diarrhea, unspecified: Secondary | ICD-10-CM | POA: Insufficient documentation

## 2019-08-11 DIAGNOSIS — Z79899 Other long term (current) drug therapy: Secondary | ICD-10-CM | POA: Diagnosis not present

## 2019-08-11 DIAGNOSIS — R112 Nausea with vomiting, unspecified: Secondary | ICD-10-CM | POA: Diagnosis present

## 2019-08-11 DIAGNOSIS — J45909 Unspecified asthma, uncomplicated: Secondary | ICD-10-CM | POA: Diagnosis not present

## 2019-08-11 LAB — COMPREHENSIVE METABOLIC PANEL
ALT: 20 U/L (ref 0–44)
AST: 23 U/L (ref 15–41)
Albumin: 3.8 g/dL (ref 3.5–5.0)
Alkaline Phosphatase: 83 U/L (ref 38–126)
Anion gap: 9 (ref 5–15)
BUN: 11 mg/dL (ref 6–20)
CO2: 24 mmol/L (ref 22–32)
Calcium: 9.4 mg/dL (ref 8.9–10.3)
Chloride: 105 mmol/L (ref 98–111)
Creatinine, Ser: 0.62 mg/dL (ref 0.44–1.00)
GFR calc Af Amer: >60 mL/min (ref >60)
GFR calc non Af Amer: >60 mL/min (ref >60)
Glucose, Bld: 87 mg/dL (ref 70–99)
Potassium: 4 mmol/L (ref 3.5–5.1)
Sodium: 138 mmol/L (ref 135–145)
Total Bilirubin: 0.3 mg/dL (ref 0.3–1.2)
Total Protein: 7.7 g/dL (ref 6.5–8.1)

## 2019-08-11 LAB — LIPASE, BLOOD: Lipase: 23 U/L (ref 11–51)

## 2019-08-11 LAB — CBC WITH DIFFERENTIAL/PLATELET
Abs Immature Granulocytes: 0.04 10*3/uL (ref 0.00–0.07)
Basophils Absolute: 0.1 10*3/uL (ref 0.0–0.1)
Basophils Relative: 1 %
Eosinophils Absolute: 0.1 10*3/uL (ref 0.0–0.5)
Eosinophils Relative: 1 %
HCT: 37.4 % (ref 36.0–46.0)
Hemoglobin: 12.2 g/dL (ref 12.0–15.0)
Immature Granulocytes: 0 %
Lymphocytes Relative: 26 %
Lymphs Abs: 3 10*3/uL (ref 0.7–4.0)
MCH: 28.3 pg (ref 26.0–34.0)
MCHC: 32.6 g/dL (ref 30.0–36.0)
MCV: 86.8 fL (ref 80.0–100.0)
Monocytes Absolute: 0.8 10*3/uL (ref 0.1–1.0)
Monocytes Relative: 7 %
Neutro Abs: 7.7 10*3/uL (ref 1.7–7.7)
Neutrophils Relative %: 65 %
Platelets: 348 10*3/uL (ref 150–400)
RBC: 4.31 MIL/uL (ref 3.87–5.11)
RDW: 14.4 % (ref 11.5–15.5)
WBC: 11.7 10*3/uL — ABNORMAL HIGH (ref 4.0–10.5)
nRBC: 0 % (ref 0.0–0.2)

## 2019-08-11 LAB — URINALYSIS, MICROSCOPIC (REFLEX)

## 2019-08-11 LAB — URINALYSIS, ROUTINE W REFLEX MICROSCOPIC
Bilirubin Urine: NEGATIVE
Glucose, UA: NEGATIVE mg/dL
Ketones, ur: NEGATIVE mg/dL
Nitrite: NEGATIVE
Protein, ur: NEGATIVE mg/dL
Specific Gravity, Urine: 1.025 (ref 1.005–1.030)
pH: 6 (ref 5.0–8.0)

## 2019-08-11 LAB — PREGNANCY, URINE: Preg Test, Ur: NEGATIVE

## 2019-08-11 MED ORDER — SODIUM CHLORIDE 0.9 % IV BOLUS
1000.0000 mL | Freq: Once | INTRAVENOUS | Status: AC
  Administered 2019-08-11: 23:00:00 1000 mL via INTRAVENOUS

## 2019-08-11 MED ORDER — ONDANSETRON HCL 4 MG/2ML IJ SOLN
4.0000 mg | Freq: Once | INTRAMUSCULAR | Status: AC
  Administered 2019-08-11: 23:00:00 4 mg via INTRAVENOUS
  Filled 2019-08-11: qty 2

## 2019-08-11 NOTE — ED Provider Notes (Signed)
[facility] EMERGENCY DEPARTMENT Provider Note   CSN: [id number] Arrival date & time: 08/11/19  2216     History Chief Complaint  Patient presents with  . Emesis    Rachel Soto is a 44 y.o. female with medical history significant for asthma who presents for evaluation of nausea vomiting and diarrhea.  Patient states she ate at [employer] on Sunday, 2 days PTA.  Patient states within a few hours after eating that she began having diarrhea.  Multiple episodes daily without any melena or bright red blood per rectum.  Patient states she is initially nauseous until yesterday she started with emesis.  Emesis NBNB.  Last episode of emesis approximately 2 PM today.  She has been able to tolerate small sips of liquids and some crackers today without emesis.  She has not take anything for symptoms.  Patient has had some right flank muscle spasms.  States she will have knots develop to her right flank which self resolve and then recur.  No midline back pain, recent injury or trauma.  She denies any urinary symptoms.  No IV drug use, bowel or bladder incontinence, saddle paresthesias.  She denies fever, chills, chest pain, shortness of breath, abdominal pain, pelvic pain, hematuria, history of stones or pyelonephritis.  Denies additional aggravating or alleviating factors.  History obtained from patient and past medical records.  No interpreter is used.  Prior abd surgeries- c-section Last PO 3 pm  HPI     Past Medical History:  Diagnosis Date  . Asthma     There are no problems to display for this patient.   Past Surgical History:  Procedure Laterality Date  . CESAREAN SECTION       OB History   No obstetric history on file.     Family History  Problem Relation Age of Onset  . Diabetes Mother   . Fibromyalgia Mother   . Hepatitis C Mother     Social History   Tobacco Use  . Smoking status: Never Smoker  . Smokeless tobacco: Never Used  Substance Use Topics   . Alcohol use: No  . Drug use: No    Home Medications Prior to Admission medications   Medication Sig Start Date End Date Taking? Authorizing Provider  loratadine (CLARITIN) 10 MG tablet Take 10 mg by mouth daily.   Yes [provider]    Allergies    Patient has no known allergies.  Review of Systems   Review of Systems  Constitutional: Negative.   HENT: Negative.   Respiratory: Negative.   Cardiovascular: Negative.   Gastrointestinal: Positive for diarrhea, nausea and vomiting. Negative for abdominal distention, abdominal pain, anal bleeding, blood in stool, constipation and rectal pain.  Genitourinary: Positive for flank pain. Negative for decreased urine volume, difficulty urinating, dyspareunia, dysuria, enuresis, frequency, genital sores, hematuria, menstrual problem, pelvic pain, urgency, vaginal bleeding, vaginal discharge and vaginal pain.  Skin: Negative.   Neurological: Negative.   All other systems reviewed and are negative.   Physical Exam Updated Vital Signs BP (!) 108/58   Pulse 94   Temp 97.8 F (36.6 C) (Oral)   Resp 16   Ht 5\' 3"  (1.6 m)   Wt (!) 138.8 kg   SpO2 100%   BMI 54.21 kg/m   Physical Exam Vitals and nursing note reviewed.  Constitutional:      General: She is not in acute distress.    Appearance: She is well-developed. She is obese. She is not ill-appearing  or toxic-appearing.  HENT:     Head: Normocephalic and atraumatic.     Nose: Nose normal.     Mouth/Throat:     Mouth: Mucous membranes are moist.     Pharynx: Oropharynx is clear.  Eyes:     Pupils: Pupils are equal, round, and reactive to light.  Cardiovascular:     Rate and Rhythm: Normal rate.     Pulses: Normal pulses.     Heart sounds: Normal heart sounds.  Pulmonary:     Effort: Pulmonary effort is normal. No respiratory distress.     Breath sounds: Normal breath sounds.  Abdominal:     General: Bowel sounds are normal. There is no distension.      Palpations: Abdomen is soft.     Tenderness: There is no abdominal tenderness. There is no right CVA tenderness, left CVA tenderness, guarding or rebound. Negative signs include Murphy's sign.     Hernia: No hernia is present.     Comments: Soft, nontender without rebound or guarding.  No overlying skin changes.  No CVA tap bilaterally.  Musculoskeletal:        General: Normal range of motion.     Cervical back: Normal range of motion.     Comments: No mdline spinal tenderness.  Skin:    General: Skin is warm and dry.     Capillary Refill: Capillary refill takes less than 2 seconds.     Comments: Brisk capillary refill.  No overlying skin changes.  Neurological:     Mental Status: She is alert.     Comments: Ambulatory from restroom to room without difficulty.    ED Results / Procedures / Treatments   Labs (all labs ordered are listed, but only abnormal results are displayed) Labs Reviewed  URINALYSIS, ROUTINE W REFLEX MICROSCOPIC - Abnormal; Notable for the following components:      Result Value   Hgb urine dipstick TRACE (*)    Leukocytes,Ua SMALL (*)    All other components within normal limits  CBC WITH DIFFERENTIAL/PLATELET - Abnormal; Notable for the following components:   WBC 11.7 (*)    All other components within normal limits  URINALYSIS, MICROSCOPIC (REFLEX) - Abnormal; Notable for the following components:   Bacteria, UA RARE (*)    All other components within normal limits  PREGNANCY, URINE  COMPREHENSIVE METABOLIC PANEL  LIPASE, BLOOD    EKG None  Radiology No results found.  Procedures Procedures (including critical care time)  Medications Ordered in ED Medications  sodium chloride 0.9 % bolus 1,000 mL (1,000 mLs Intravenous New Bag/Given 08/11/19 2301)  ondansetron (ZOFRAN) injection 4 mg (4 mg Intravenous Given 08/11/19 2301)    ED Course  I have reviewed the triage vital signs and the nursing notes.  Pertinent labs & imaging results that were  available during my care of the patient were reviewed by me and considered in my medical decision making (see chart for details).  44 year old female appears otherwise well presents for evaluation of nausea vomiting and diarrhea.  She is afebrile, nonseptic, not ill-appearing.  Symptoms began after eating little [place].  She has been able to tolerate small sips of liquids and crackers today.  No melena or bright red blood per rectum.  No emesis.  She denies any NSAID or alcohol use.  Abdomen soft, nontender.  Heart and lungs clear.  Patient also with right-sided muscle spasms.  No prior history of stones.  Patient denies dysuria, hematuria.  Negative CVA tap bilaterally.  She has no red flags for back pain.  Will obtain labs, urine and reevaluate.  Pregnancy test negative UA with trace leuks and rare bacteria. No urinary sx.  Care transferred to Dr. Nicanor Alcon who will follow up on labs. If labs negative and patient able to tolerate PO likely dc home with muscle relaxer for back spasms and zofran for emesis.    MDM Rules/Calculators/A&P                       Final Clinical Impression(s) / ED Diagnoses Final diagnoses:  Nausea vomiting and diarrhea    Rx / DC Orders ED Discharge Orders    None       Demica Zook A, PA-C 08/11/19 2310    Terald Sleeper, MD 08/12/19 1024

## 2019-08-11 NOTE — ED Notes (Signed)
ED Provider at bedside. 

## 2019-08-11 NOTE — ED Triage Notes (Signed)
Pt reports having diarrhea 2 days ago and vomiting yesterday. Pt c/o lower back pain today.

## 2020-08-31 DIAGNOSIS — J45909 Unspecified asthma, uncomplicated: Secondary | ICD-10-CM | POA: Insufficient documentation

## 2020-08-31 DIAGNOSIS — R1012 Left upper quadrant pain: Secondary | ICD-10-CM | POA: Insufficient documentation

## 2020-08-31 DIAGNOSIS — R1032 Left lower quadrant pain: Secondary | ICD-10-CM | POA: Insufficient documentation

## 2020-08-31 DIAGNOSIS — R110 Nausea: Secondary | ICD-10-CM | POA: Insufficient documentation

## 2020-09-01 ENCOUNTER — Encounter (HOSPITAL_COMMUNITY): Payer: Self-pay

## 2020-09-01 ENCOUNTER — Emergency Department (HOSPITAL_COMMUNITY): Payer: Self-pay

## 2020-09-01 ENCOUNTER — Emergency Department (HOSPITAL_COMMUNITY)
Admission: EM | Admit: 2020-09-01 | Discharge: 2020-09-01 | Disposition: A | Discharge status: Home / Self Care | Payer: Self-pay | Attending: Emergency Medicine | Admitting: Emergency Medicine

## 2020-09-01 ENCOUNTER — Other Ambulatory Visit: Payer: Self-pay

## 2020-09-01 DIAGNOSIS — R109 Unspecified abdominal pain: Secondary | ICD-10-CM

## 2020-09-01 LAB — CBC WITH DIFFERENTIAL/PLATELET
Abs Immature Granulocytes: 0.08 10*3/uL — ABNORMAL HIGH (ref 0.00–0.07)
Basophils Absolute: 0 10*3/uL (ref 0.0–0.1)
Basophils Relative: 0 %
Eosinophils Absolute: 0.2 10*3/uL (ref 0.0–0.5)
Eosinophils Relative: 3 %
HCT: 36.4 % (ref 36.0–46.0)
Hemoglobin: 11.7 g/dL — ABNORMAL LOW (ref 12.0–15.0)
Immature Granulocytes: 1 %
Lymphocytes Relative: 23 %
Lymphs Abs: 1.8 10*3/uL (ref 0.7–4.0)
MCH: 28.4 pg (ref 26.0–34.0)
MCHC: 32.1 g/dL (ref 30.0–36.0)
MCV: 88.3 fL (ref 80.0–100.0)
Monocytes Absolute: 0.4 10*3/uL (ref 0.1–1.0)
Monocytes Relative: 6 %
Neutro Abs: 5.3 10*3/uL (ref 1.7–7.7)
Neutrophils Relative %: 67 %
Platelets: 274 10*3/uL (ref 150–400)
RBC: 4.12 MIL/uL (ref 3.87–5.11)
RDW: 14.4 % (ref 11.5–15.5)
WBC: 7.9 10*3/uL (ref 4.0–10.5)
nRBC: 0 % (ref 0.0–0.2)

## 2020-09-01 LAB — COMPREHENSIVE METABOLIC PANEL
ALT: 14 U/L (ref 0–44)
AST: 17 U/L (ref 15–41)
Albumin: 3.7 g/dL (ref 3.5–5.0)
Alkaline Phosphatase: 84 U/L (ref 38–126)
Anion gap: 8 (ref 5–15)
BUN: 13 mg/dL (ref 6–20)
CO2: 23 mmol/L (ref 22–32)
Calcium: 8.7 mg/dL — ABNORMAL LOW (ref 8.9–10.3)
Chloride: 104 mmol/L (ref 98–111)
Creatinine, Ser: 0.49 mg/dL (ref 0.44–1.00)
GFR, Estimated: >60 mL/min (ref >60)
Glucose, Bld: 115 mg/dL — ABNORMAL HIGH (ref 70–99)
Potassium: 4.1 mmol/L (ref 3.5–5.1)
Sodium: 135 mmol/L (ref 135–145)
Total Bilirubin: 0.6 mg/dL (ref 0.3–1.2)
Total Protein: 7.2 g/dL (ref 6.5–8.1)

## 2020-09-01 LAB — URINALYSIS, ROUTINE W REFLEX MICROSCOPIC
Bacteria, UA: NONE SEEN
Bilirubin Urine: NEGATIVE
Glucose, UA: NEGATIVE mg/dL
Ketones, ur: NEGATIVE mg/dL
Leukocytes,Ua: NEGATIVE
Nitrite: NEGATIVE
Protein, ur: NEGATIVE mg/dL
Specific Gravity, Urine: 1.014 (ref 1.005–1.030)
pH: 5 (ref 5.0–8.0)

## 2020-09-01 LAB — I-STAT BETA HCG BLOOD, ED (MC, WL, AP ONLY): I-stat hCG, quantitative: <5 m[IU]/mL (ref <5)

## 2020-09-01 LAB — LIPASE, BLOOD: Lipase: 27 U/L (ref 11–51)

## 2020-09-01 MED ORDER — ONDANSETRON 4 MG PO TBDP: 4.0000 mg | ORAL_TABLET | Freq: Three times a day (TID) | ORAL | 0 refills | Status: DC | PRN

## 2020-09-01 MED ORDER — IOHEXOL 300 MG/ML  SOLN
100.0000 mL | Freq: Once | INTRAMUSCULAR | Status: AC | PRN
  Administered 2020-09-01: 100 mL via INTRAVENOUS

## 2020-09-01 MED ORDER — MORPHINE SULFATE (PF) 4 MG/ML IV SOLN
4.0000 mg | Freq: Once | INTRAVENOUS | Status: AC
  Administered 2020-09-01: 4 mg via INTRAVENOUS
  Filled 2020-09-01: qty 1

## 2020-09-01 MED ORDER — LACTATED RINGERS IV BOLUS
1000.0000 mL | Freq: Once | INTRAVENOUS | Status: AC
  Administered 2020-09-01: 1000 mL via INTRAVENOUS

## 2020-09-01 NOTE — ED Triage Notes (Signed)
Pt sts LLQ abdominal pain that has been ongoing since 2/28. Seen at [facility] twice for same. MD there told pt it looked like she passed a kidney stone. Pt represented after no relief and was given a shot of rocephin and something for pain. Pt sts pain is still present.

## 2020-09-01 NOTE — Discharge Instructions (Addendum)
You can take 600 mg of ibuprofen every 6 hours, you can take 1000 mg of Tylenol every 6 hours, you can alternate these every 3 or you can take them together.  

## 2020-09-01 NOTE — ED Notes (Signed)
An After Visit Summary was printed and given to the patient. Discharge instructions given and no further questions at this time.  

## 2020-09-01 NOTE — ED Provider Notes (Signed)
[facility]-EMERGENCY DEPT Provider Note   CSN: [id number] Arrival date & time: 08/31/20  2352     History Chief Complaint  Patient presents with  . Abdominal Pain    Rachel Soto is a 45 y.o. female.   Abdominal Pain Pain location:  LUQ, LLQ and L flank Pain quality: aching   Pain radiates to:  Does not radiate Pain severity:  Moderate Onset quality:  Gradual Timing:  Constant Progression:  Waxing and waning Chronicity:  New Relieved by:  Nothing Worsened by:  Nothing Ineffective treatments:  None tried Associated symptoms: nausea   Associated symptoms: no chest pain, no chills, no constipation, no cough, no diarrhea, no dysuria, no fever, no shortness of breath and no vomiting   Risk factors: obesity        Past Medical History:  Diagnosis Date  . Asthma     There are no problems to display for this patient.   Past Surgical History:  Procedure Laterality Date  . CESAREAN SECTION       OB History   No obstetric history on file.     Family History  Problem Relation Age of Onset  . Diabetes Mother   . Fibromyalgia Mother   . Hepatitis C Mother     Social History   Tobacco Use  . Smoking status: Never Smoker  . Smokeless tobacco: Never Used  Substance Use Topics  . Alcohol use: No  . Drug use: No    Home Medications Prior to Admission medications   Medication Sig Start Date End Date Taking? Authorizing Provider  ondansetron (ZOFRAN ODT) 4 MG disintegrating tablet Take 1 tablet (4 mg total) by mouth every 8 (eight) hours as needed for up to 10 doses for nausea or vomiting. 09/01/20  Yes Sabino Donovan, MD  loratadine (CLARITIN) 10 MG tablet Take 10 mg by mouth daily.    [provider]    Allergies    Patient has no known allergies.  Review of Systems   Review of Systems  Constitutional: Negative for chills and fever.  HENT: Negative for congestion and rhinorrhea.   Respiratory: Negative for cough and  shortness of breath.   Cardiovascular: Negative for chest pain and palpitations.  Gastrointestinal: Positive for abdominal pain and nausea. Negative for constipation, diarrhea and vomiting.  Genitourinary: Positive for flank pain. Negative for difficulty urinating and dysuria.  Musculoskeletal: Negative for arthralgias and back pain.  Skin: Negative for rash and wound.  Neurological: Negative for light-headedness and headaches.    Physical Exam Updated Vital Signs BP 122/67   Pulse 62   Temp 98 F (36.7 C) (Oral)   Resp (!) 22   Ht 5\' 3"  (1.6 m)   Wt 134.3 kg   LMP 09/01/2020 (Approximate) Comment: neg   SpO2 96%   BMI 52.43 kg/m   Physical Exam Vitals and nursing note reviewed. Exam conducted with a chaperone present.  Constitutional:      General: She is not in acute distress.    Appearance: Normal appearance.  HENT:     Head: Normocephalic and atraumatic.     Nose: No rhinorrhea.  Eyes:     General:        Right eye: No discharge.        Left eye: No discharge.     Conjunctiva/sclera: Conjunctivae normal.  Cardiovascular:     Rate and Rhythm: Normal rate and regular rhythm.  Pulmonary:     Effort: Pulmonary effort is normal.  No respiratory distress.     Breath sounds: No stridor.  Abdominal:     General: Abdomen is flat. There is no distension.     Palpations: Abdomen is soft.     Tenderness: There is abdominal tenderness in the left upper quadrant and left lower quadrant. There is no right CVA tenderness, left CVA tenderness, guarding or rebound. Negative signs include Murphy's sign, Rovsing's sign and McBurney's sign.  Musculoskeletal:        General: No tenderness or signs of injury.  Skin:    General: Skin is warm and dry.  Neurological:     General: No focal deficit present.     Mental Status: She is alert. Mental status is at baseline.     Motor: No weakness.  Psychiatric:        Mood and Affect: Mood normal.        Behavior: Behavior normal.     ED  Results / Procedures / Treatments   Labs (all labs ordered are listed, but only abnormal results are displayed) Labs Reviewed  CBC WITH DIFFERENTIAL/PLATELET - Abnormal; Notable for the following components:      Result Value   Hemoglobin 11.7 (*)    Abs Immature Granulocytes 0.08 (*)    All other components within normal limits  COMPREHENSIVE METABOLIC PANEL - Abnormal; Notable for the following components:   Glucose, Bld 115 (*)    Calcium 8.7 (*)    All other components within normal limits  URINALYSIS, ROUTINE W REFLEX MICROSCOPIC - Abnormal; Notable for the following components:   Hgb urine dipstick SMALL (*)    All other components within normal limits  LIPASE, BLOOD  I-STAT BETA HCG BLOOD, ED ([facility], [facility], [facility] ONLY)    EKG None  Radiology CT ABDOMEN PELVIS W CONTRAST  Result Date: 09/01/2020 CLINICAL DATA:  Abdominal pain, primarily left-sided EXAM: CT ABDOMEN AND PELVIS WITH CONTRAST TECHNIQUE: Multidetector CT imaging of the abdomen and pelvis was performed using the standard protocol following bolus administration of intravenous contrast. CONTRAST:  OMNIPAQUE IOHEXOL 300 MG/ML  SOLN COMPARISON:  None. FINDINGS: Lower chest: There is slight bibasilar atelectasis. There is no lung base edema or consolidation. Hepatobiliary: No focal liver lesions are appreciable. Gallbladder wall is not appreciably thickened. There is no biliary duct dilatation. Pancreas: There is no appreciable pancreatic mass or inflammatory focus. Spleen: No splenic lesions are evident. Adrenals/Urinary Tract: Adrenals bilaterally appear normal. There is no evident renal mass or hydronephrosis on either side. There is no appreciable renal or ureteral calculus on either side. Urinary bladder is midline with wall thickness within normal limits. Stomach/Bowel: No evident diverticular disease by CT. No bowel wall or mesenteric thickening. No evident bowel obstruction. Terminal ileum appears normal. There is no free  air or portal venous air. Vascular/Lymphatic: There is no abdominal aortic aneurysm. No arterial vascular lesions are evident. Major venous structures appear patent. There is no appreciable adenopathy in the abdomen or pelvis. Reproductive: Uterus is anteverted. Tampon present. No adnexal mass evident. Other: Appendix appears normal. No abscess or ascites in the abdomen or pelvis. There is slight fat in the umbilicus. Musculoskeletal: No blastic or lytic bone lesions. No intramuscular lesions. IMPRESSION: 1. A cause for patient's symptoms has not been established with this study. 2. No bowel wall thickening or bowel obstruction. No abscess in the abdomen or pelvis. Appendix appears normal. 3. No renal or ureteral calculus. No hydronephrosis. Urinary bladder wall thickness normal. Electronically Signed   By: Chrissie Noa  Margarita Grizzle III M.D.   On: 09/01/2020 12:17    Procedures Ultrasound ED Peripheral IV (Provider)  Date/Time: 09/01/2020 9:15 AM Performed by: Sabino Donovan, MD Authorized by: Sabino Donovan, MD   Procedure details:    Indications: hydration, multiple failed IV attempts and poor IV access     Skin Prep: chlorhexidine gluconate     Location:  Left AC   Angiocath:  20 G   Bedside Ultrasound Guided: Yes     Images: not archived     Patient tolerated procedure without complications: Yes     Dressing applied: Yes       Medications Ordered in ED Medications  lactated ringers bolus 1,000 mL (1,000 mLs Intravenous New Bag/Given 09/01/20 0920)  morphine 4 MG/ML injection 4 mg (4 mg Intravenous Given 09/01/20 0922)  iohexol (OMNIPAQUE) 300 MG/ML solution 100 mL (100 mLs Intravenous Contrast Given 09/01/20 1137)    ED Course  I have reviewed the triage vital signs and the nursing notes.  Pertinent labs & imaging results that were available during my care of the patient were reviewed by me and considered in my medical decision making (see chart for details).    MDM Rules/Calculators/A&P                           Left-sided abdominal pain.  Patient states she has urinary frequency and burning.  Also states when she eats certain foods her belly pain gets worse.  Was seen previously at outside facility had CT scans done twice.  The first 1 showed some peripancreatic stranding and she had an elevated lipase at that time.  Possibly this is related pancreatitis.  She was told this was likely related to possible UTI or having passed renal stones.  Here her vital signs are stable she is afebrile she has a nonperitoneal neck but tender abdomen.  She will get repeat laboratory studies pain control fluids and possibly CT imaging with contrast.  Patient agrees to this plan.  Patient screening laboratory studies are unremarkable.  No signs of acute inflammation improving white blood cell count improving lipase.  Kidney function is normal.  Urinalysis without signs of infection.  I advised her to continue the antibiotic regimen she is currently on for UTI though.  CT scan with contrast reviewed by radiology myself shows no acute abnormality.  Patient safe for outpatient management with return precautions given.  Final Clinical Impression(s) / ED Diagnoses Final diagnoses:  Undifferentiated abdominal pain    Rx / DC Orders ED Discharge Orders         Ordered    ondansetron (ZOFRAN ODT) 4 MG disintegrating tablet  Every 8 hours PRN        09/01/20 1323           Sabino Donovan, MD 09/01/20 1324

## 2020-09-01 NOTE — ED Notes (Signed)
Family at bedside. 

## 2021-02-10 ENCOUNTER — Emergency Department (HOSPITAL_BASED_OUTPATIENT_CLINIC_OR_DEPARTMENT_OTHER): Payer: Self-pay

## 2021-02-10 ENCOUNTER — Other Ambulatory Visit: Payer: Self-pay

## 2021-02-10 ENCOUNTER — Encounter (HOSPITAL_BASED_OUTPATIENT_CLINIC_OR_DEPARTMENT_OTHER): Payer: Self-pay | Admitting: *Deleted

## 2021-02-10 DIAGNOSIS — Z20822 Contact with and (suspected) exposure to covid-19: Secondary | ICD-10-CM | POA: Insufficient documentation

## 2021-02-10 DIAGNOSIS — R6883 Chills (without fever): Secondary | ICD-10-CM | POA: Insufficient documentation

## 2021-02-10 DIAGNOSIS — J45909 Unspecified asthma, uncomplicated: Secondary | ICD-10-CM | POA: Insufficient documentation

## 2021-02-10 DIAGNOSIS — R197 Diarrhea, unspecified: Secondary | ICD-10-CM | POA: Insufficient documentation

## 2021-02-10 DIAGNOSIS — R059 Cough, unspecified: Secondary | ICD-10-CM | POA: Insufficient documentation

## 2021-02-10 DIAGNOSIS — R112 Nausea with vomiting, unspecified: Secondary | ICD-10-CM | POA: Insufficient documentation

## 2021-02-10 NOTE — ED Triage Notes (Signed)
Pt has been exposed to covid and has been symptomatic with lethargy, URI symptons and GI symtpoms. Pt states that she is here for a covid test. No distress noted in triage

## 2021-02-11 ENCOUNTER — Emergency Department (HOSPITAL_BASED_OUTPATIENT_CLINIC_OR_DEPARTMENT_OTHER)
Admission: EM | Admit: 2021-02-11 | Discharge: 2021-02-11 | Disposition: A | Discharge status: Home / Self Care | Payer: Self-pay | Attending: Emergency Medicine | Admitting: Emergency Medicine

## 2021-02-11 ENCOUNTER — Encounter (HOSPITAL_BASED_OUTPATIENT_CLINIC_OR_DEPARTMENT_OTHER): Payer: Self-pay | Admitting: Emergency Medicine

## 2021-02-11 DIAGNOSIS — Z20822 Contact with and (suspected) exposure to covid-19: Secondary | ICD-10-CM

## 2021-02-11 LAB — RESP PANEL BY RT-PCR (FLU A&B, COVID) ARPGX2
Influenza A by PCR: NEGATIVE
Influenza B by PCR: NEGATIVE
SARS Coronavirus 2 by RT PCR: NEGATIVE

## 2021-02-11 NOTE — ED Provider Notes (Signed)
[facility] EMERGENCY DEPARTMENT Provider Note   CSN: [id number] Arrival date & time: 02/10/21  2307     History Chief Complaint  Patient presents with   Covid Exposure    Rachel Soto is a 45 y.o. female.  The history is provided by the patient.  Illness Location:  Body Quality:  Body aches Severity:  Moderate Onset quality:  Gradual Duration:  1 week Timing:  Constant Progression:  Unchanged Chronicity:  New Context:  In [place] and half the party tested positive for covid Relieved by:  Nothing Worsened by:  Nothing Ineffective treatments:  None Associated symptoms: cough, diarrhea, nausea and vomiting   Associated symptoms: no fever and no rash   Associated symptoms comment:  Chills     Past Medical History:  Diagnosis Date   Asthma     There are no problems to display for this patient.   Past Surgical History:  Procedure Laterality Date   CESAREAN SECTION       OB History   No obstetric history on file.     Family History  Problem Relation Age of Onset   Diabetes Mother    Fibromyalgia Mother    Hepatitis C Mother     Social History   Tobacco Use   Smoking status: Never   Smokeless tobacco: Never  Substance Use Topics   Alcohol use: No   Drug use: No    Home Medications Prior to Admission medications   Medication Sig Start Date End Date Taking? Authorizing Provider  loratadine (CLARITIN) 10 MG tablet Take 10 mg by mouth daily.    [provider]  ondansetron (ZOFRAN ODT) 4 MG disintegrating tablet Take 1 tablet (4 mg total) by mouth every 8 (eight) hours as needed for up to 10 doses for nausea or vomiting. 09/01/20   Sabino Donovan, MD    Allergies    Patient has no known allergies.  Review of Systems   Review of Systems  Constitutional:  Positive for chills. Negative for fever.  HENT:  Negative for facial swelling.   Eyes:  Negative for redness.  Respiratory:  Positive for cough.   Cardiovascular:  Negative  for leg swelling.  Gastrointestinal:  Positive for diarrhea, nausea and vomiting.  Genitourinary:  Negative for difficulty urinating.  Musculoskeletal:  Negative for neck stiffness.  Skin:  Negative for rash.  Neurological:  Negative for facial asymmetry.  Psychiatric/Behavioral:  Negative for agitation.   All other systems reviewed and are negative.  Physical Exam Updated Vital Signs BP (!) 133/93 (BP Location: Right Arm)   Pulse 71   Temp 98.2 F (36.8 C) (Oral)   Resp 16   Wt 134.3 kg   SpO2 100%   BMI 52.43 kg/m   Physical Exam Vitals and nursing note reviewed.  Constitutional:      General: She is not in acute distress.    Appearance: Normal appearance.  HENT:     Head: Normocephalic and atraumatic.     Nose: Nose normal.  Eyes:     Conjunctiva/sclera: Conjunctivae normal.     Pupils: Pupils are equal, round, and reactive to light.  Cardiovascular:     Rate and Rhythm: Normal rate and regular rhythm.     Pulses: Normal pulses.     Heart sounds: Normal heart sounds.  Pulmonary:     Effort: Pulmonary effort is normal.     Breath sounds: Normal breath sounds.  Abdominal:     General: Abdomen is flat. Bowel  sounds are normal.     Palpations: Abdomen is soft.     Tenderness: There is no abdominal tenderness. There is no guarding or rebound.  Musculoskeletal:        General: Normal range of motion.     Cervical back: Normal range of motion and neck supple.  Skin:    General: Skin is warm and dry.     Capillary Refill: Capillary refill takes less than 2 seconds.  Neurological:     General: No focal deficit present.     Mental Status: She is alert and oriented to person, place, and time.     Deep Tendon Reflexes: Reflexes normal.  Psychiatric:        Mood and Affect: Mood normal.        Behavior: Behavior normal.    ED Results / Procedures / Treatments   Labs (all labs ordered are listed, but only abnormal results are displayed) Labs Reviewed  RESP PANEL BY  RT-PCR (FLU A&B, COVID) [id number]    EKG None  Radiology DG Chest 2 View  Result Date: 02/10/2021 CLINICAL DATA:  COVID exposure EXAM: CHEST - 2 VIEW COMPARISON:  10/26/2013 FINDINGS: The heart size and mediastinal contours are within normal limits. Both lungs are clear. The visualized skeletal structures are unremarkable. IMPRESSION: No active cardiopulmonary disease. Electronically Signed   By: Jasmine Pang M.D.   On: 02/10/2021 23:52    Procedures Procedures   Medications Ordered in ED Medications - No data to display  ED Course  I have reviewed the triage vital signs and the nursing notes.  Pertinent labs & imaging results that were available during my care of the patient were reviewed by me and considered in my medical decision making (see chart for details).    MCOVID swab sent.  Well appearing.  Results will be in my chart.    Rachel Soto was evaluated in Emergency Department on 02/11/2021 for the symptoms described in the history of present illness. She was evaluated in the context of the global COVID-19 pandemic, which necessitated consideration that the patient might be at risk for infection with the SARS-CoV-2 virus that causes COVID-19. Institutional protocols and algorithms that pertain to the evaluation of patients at risk for COVID-19 are in a state of rapid change based on information released by regulatory bodies including the CDC and federal and state organizations. These policies and algorithms were followed during the patient's care in the ED.  Final Clinical Impression(s) / ED Diagnoses Final diagnoses:  None   Return for intractable cough, coughing up blood, fevers > 100.4 unrelieved by medication, shortness of breath, intractable vomiting, chest pain, shortness of breath, weakness, numbness, changes in speech, facial asymmetry, abdominal pain, passing out, Inability to tolerate liquids or food, cough, altered mental status or any concerns. No signs of systemic  illness or infection. The patient is nontoxic-appearing on exam and vital signs are within normal limits. I have reviewed the triage vital signs and the nursing notes. Pertinent labs & imaging results that were available during my care of the patient were reviewed by me and considered in my medical decision making (see chart for details). After history, exam, and medical workup I feel the patient has been appropriately medically screened and is safe for discharge home. Pertinent diagnoses were discussed with the patient. Patient was given return precautions. Rx / DC Orders ED Discharge Orders     None        Whitaker Holderman, MD 02/11/21 3976

## 2021-06-05 ENCOUNTER — Other Ambulatory Visit: Payer: Self-pay

## 2021-06-05 ENCOUNTER — Emergency Department (HOSPITAL_BASED_OUTPATIENT_CLINIC_OR_DEPARTMENT_OTHER)
Admission: EM | Admit: 2021-06-05 | Discharge: 2021-06-05 | Disposition: A | Discharge status: Home / Self Care | Payer: Medicaid Other | Attending: Emergency Medicine | Admitting: Emergency Medicine

## 2021-06-05 ENCOUNTER — Emergency Department (HOSPITAL_BASED_OUTPATIENT_CLINIC_OR_DEPARTMENT_OTHER): Payer: Medicaid Other

## 2021-06-05 ENCOUNTER — Encounter (HOSPITAL_BASED_OUTPATIENT_CLINIC_OR_DEPARTMENT_OTHER): Payer: Self-pay

## 2021-06-05 DIAGNOSIS — I10 Essential (primary) hypertension: Secondary | ICD-10-CM | POA: Insufficient documentation

## 2021-06-05 DIAGNOSIS — J45909 Unspecified asthma, uncomplicated: Secondary | ICD-10-CM | POA: Insufficient documentation

## 2021-06-05 DIAGNOSIS — M79602 Pain in left arm: Secondary | ICD-10-CM | POA: Insufficient documentation

## 2021-06-05 DIAGNOSIS — R12 Heartburn: Secondary | ICD-10-CM | POA: Insufficient documentation

## 2021-06-05 DIAGNOSIS — Z79899 Other long term (current) drug therapy: Secondary | ICD-10-CM | POA: Insufficient documentation

## 2021-06-05 DIAGNOSIS — R0789 Other chest pain: Secondary | ICD-10-CM | POA: Insufficient documentation

## 2021-06-05 LAB — CBC
HCT: 37.1 % (ref 36.0–46.0)
Hemoglobin: 12.2 g/dL (ref 12.0–15.0)
MCH: 28.2 pg (ref 26.0–34.0)
MCHC: 32.9 g/dL (ref 30.0–36.0)
MCV: 85.9 fL (ref 80.0–100.0)
Platelets: 294 10*3/uL (ref 150–400)
RBC: 4.32 MIL/uL (ref 3.87–5.11)
RDW: 13.3 % (ref 11.5–15.5)
WBC: 6.6 10*3/uL (ref 4.0–10.5)
nRBC: 0 % (ref 0.0–0.2)

## 2021-06-05 LAB — BASIC METABOLIC PANEL
Anion gap: 8 (ref 5–15)
BUN: 10 mg/dL (ref 6–20)
CO2: 27 mmol/L (ref 22–32)
Calcium: 9.1 mg/dL (ref 8.9–10.3)
Chloride: 105 mmol/L (ref 98–111)
Creatinine, Ser: 0.51 mg/dL (ref 0.44–1.00)
GFR, Estimated: >60 mL/min (ref >60)
Glucose, Bld: 133 mg/dL — ABNORMAL HIGH (ref 70–99)
Potassium: 3.4 mmol/L — ABNORMAL LOW (ref 3.5–5.1)
Sodium: 140 mmol/L (ref 135–145)

## 2021-06-05 LAB — TROPONIN I (HIGH SENSITIVITY)
Troponin I (High Sensitivity): 3 ng/L (ref <18)
Troponin I (High Sensitivity): 2 ng/L (ref <18)

## 2021-06-05 LAB — D-DIMER, QUANTITATIVE: D-Dimer, Quant: 0.3 ug/mL-FEU (ref 0.00–0.50)

## 2021-06-05 NOTE — Discharge Instructions (Addendum)
You were seen today for chest discomfort.  Your work-up was reassuring including heart testing.  Given your risk factors, follow-up with cardiology for outpatient testing.

## 2021-06-05 NOTE — ED Triage Notes (Signed)
Pt presents with ongoing chest pain and night swears for "a couple months" pt taking OTC meds for heartburn w/no relief

## 2021-06-05 NOTE — ED Provider Notes (Signed)
[facility] EMERGENCY DEPT Provider Note   CSN: [id number] Arrival date & time: 06/05/21  0051     History Chief Complaint  Patient presents with   Chest Pain    Rachel Soto is a 45 y.o. female.  HPI     This is a 45 year old female with a history of hypertension and asthma who presents with chest discomfort.  Patient reports that she has had waxing and waning left-sided chest discomfort for several months.  She describes it as left-sided and pressure-like.  Sometimes it radiates into the left arm.  It is not necessarily exertional but she does note more significant pain at work and "I work 12 to 16-hour days."  She states that she took the day off yesterday because she had increasing discomfort.  She initially thought that she had heartburn and took over-the-counter medications with minimal relief.  No pleuritic nature to the pain.  No cough, fever, shortness of breath.  Denies early family history of heart disease, hyperlipidemia, smoking.  Currently she states she is having mild discomfort at 4 out of 10  Past Medical History:  Diagnosis Date   Asthma     There are no problems to display for this patient.   Past Surgical History:  Procedure Laterality Date   CESAREAN SECTION       OB History   No obstetric history on file.     Family History  Problem Relation Age of Onset   Diabetes Mother    Fibromyalgia Mother    Hepatitis C Mother     Social History   Tobacco Use   Smoking status: Never   Smokeless tobacco: Never  Substance Use Topics   Alcohol use: No   Drug use: No    Home Medications Prior to Admission medications   Medication Sig Start Date End Date Taking? Authorizing Provider  loratadine (CLARITIN) 10 MG tablet Take 10 mg by mouth daily.    [provider]  ondansetron (ZOFRAN ODT) 4 MG disintegrating tablet Take 1 tablet (4 mg total) by mouth every 8 (eight) hours as needed for up to 10 doses for nausea or  vomiting. 09/01/20   Sabino Donovan, MD    Allergies    Patient has no known allergies.  Review of Systems   Review of Systems  Constitutional:  Negative for fever.  Respiratory:  Negative for cough and shortness of breath.   Cardiovascular:  Positive for chest pain. Negative for leg swelling.  Gastrointestinal:  Negative for abdominal pain, nausea and vomiting.  Genitourinary:  Negative for dysuria.  All other systems reviewed and are negative.  Physical Exam Updated Vital Signs BP 136/70   Pulse (!) 56   Temp 97.9 F (36.6 C) (Oral)   Resp 19   SpO2 100%   Physical Exam Vitals and nursing note reviewed.  Constitutional:      Appearance: She is well-developed. She is obese. She is not ill-appearing.  HENT:     Head: Normocephalic and atraumatic.  Eyes:     Pupils: Pupils are equal, round, and reactive to light.  Cardiovascular:     Rate and Rhythm: Normal rate and regular rhythm.     Heart sounds: Normal heart sounds.  Pulmonary:     Effort: Pulmonary effort is normal. No respiratory distress.     Breath sounds: No wheezing.  Abdominal:     General: Bowel sounds are normal.     Palpations: Abdomen is soft.  Musculoskeletal:  Cervical back: Neck supple.     Right lower leg: No tenderness. No edema.     Left lower leg: No tenderness. No edema.  Skin:    General: Skin is warm and dry.  Neurological:     Mental Status: She is alert and oriented to person, place, and time.  Psychiatric:        Mood and Affect: Mood normal.    ED Results / Procedures / Treatments   Labs (all labs ordered are listed, but only abnormal results are displayed) Labs Reviewed  BASIC METABOLIC PANEL - Abnormal; Notable for the following components:      Result Value   Potassium 3.4 (*)    Glucose, Bld 133 (*)    All other components within normal limits  CBC  D-DIMER, QUANTITATIVE  PREGNANCY, URINE  TROPONIN I (HIGH SENSITIVITY)  TROPONIN I (HIGH SENSITIVITY)    EKG EKG  Interpretation  Date/Time:  Monday June 05 2021 01:04:44 EST Ventricular Rate:  77 PR Interval:  146 QRS Duration: 99 QT Interval:  406 QTC Calculation: 460 R Axis:   73 Text Interpretation: Sinus rhythm Confirmed by Ross Marcus ([id number]) on 06/05/2021 1:06:12 AM  Radiology DG Chest Portable 1 View  Result Date: 06/05/2021 CLINICAL DATA:  Chest pain, night sweats EXAM: PORTABLE CHEST 1 VIEW COMPARISON:  02/10/2021 FINDINGS: Lungs are clear.  No pleural effusion or pneumothorax. The heart is normal in size. IMPRESSION: No evidence of acute cardiopulmonary disease. Electronically Signed   By: Charline Bills M.D.   On: 06/05/2021 01:29    Procedures Procedures   Medications Ordered in ED Medications - No data to display  ED Course  I have reviewed the triage vital signs and the nursing notes.  Pertinent labs & imaging results that were available during my care of the patient were reviewed by me and considered in my medical decision making (see chart for details).    MDM Rules/Calculators/A&P                           Patient presents with chest pain.  She is overall nontoxic-appearing and vital signs are initially notable for blood pressure of 161/89.  This improved to 136/70.  She is morbidly obese.  EKG shows no evidence of acute ischemia or arrhythmia.  It is essentially normal.  Heart score is 1 for risk factors.  Low suspicion for ACS.  Troponin x2 negative.  Screening D-dimer was sent to rule stratify for PE.  This was negative.  Chest x-ray shows no evidence of pneumothorax or pneumonia.  Patient work-up overall reassuring.  Etiology of pain is unclear; however, does not appear to be acute emergent process.  Recommend cardiology follow-up for definitive evaluation.  Other labs reviewed without significant abnormalities or metabolic derangements.  After history, exam, and medical workup I feel the patient has been appropriately medically screened and is safe for  discharge home. Pertinent diagnoses were discussed with the patient. Patient was given return precautions.  Final Clinical Impression(s) / ED Diagnoses Final diagnoses:  Atypical chest pain    Rx / DC Orders ED Discharge Orders     None        Tytiana Coles, Mayer Masker, MD 06/05/21 [phone]

## 2021-06-11 ENCOUNTER — Other Ambulatory Visit: Payer: Self-pay

## 2021-06-11 ENCOUNTER — Encounter (HOSPITAL_BASED_OUTPATIENT_CLINIC_OR_DEPARTMENT_OTHER): Payer: Self-pay | Admitting: Emergency Medicine

## 2021-06-11 ENCOUNTER — Emergency Department (HOSPITAL_BASED_OUTPATIENT_CLINIC_OR_DEPARTMENT_OTHER)
Admission: EM | Admit: 2021-06-11 | Discharge: 2021-06-11 | Disposition: A | Discharge status: Home / Self Care | Payer: Medicaid Other | Attending: Emergency Medicine | Admitting: Emergency Medicine

## 2021-06-11 DIAGNOSIS — R002 Palpitations: Secondary | ICD-10-CM

## 2021-06-11 DIAGNOSIS — R519 Headache, unspecified: Secondary | ICD-10-CM | POA: Insufficient documentation

## 2021-06-11 DIAGNOSIS — N9489 Other specified conditions associated with female genital organs and menstrual cycle: Secondary | ICD-10-CM | POA: Insufficient documentation

## 2021-06-11 DIAGNOSIS — J45909 Unspecified asthma, uncomplicated: Secondary | ICD-10-CM | POA: Insufficient documentation

## 2021-06-11 DIAGNOSIS — R110 Nausea: Secondary | ICD-10-CM | POA: Insufficient documentation

## 2021-06-11 DIAGNOSIS — R0789 Other chest pain: Secondary | ICD-10-CM | POA: Insufficient documentation

## 2021-06-11 LAB — CBC WITH DIFFERENTIAL/PLATELET
Abs Immature Granulocytes: 0.02 10*3/uL (ref 0.00–0.07)
Basophils Absolute: 0 10*3/uL (ref 0.0–0.1)
Basophils Relative: 0 %
Eosinophils Absolute: 0.2 10*3/uL (ref 0.0–0.5)
Eosinophils Relative: 2 %
HCT: 34.2 % — ABNORMAL LOW (ref 36.0–46.0)
Hemoglobin: 11.4 g/dL — ABNORMAL LOW (ref 12.0–15.0)
Immature Granulocytes: 0 %
Lymphocytes Relative: 25 %
Lymphs Abs: 2.1 10*3/uL (ref 0.7–4.0)
MCH: 28.5 pg (ref 26.0–34.0)
MCHC: 33.3 g/dL (ref 30.0–36.0)
MCV: 85.5 fL (ref 80.0–100.0)
Monocytes Absolute: 0.5 10*3/uL (ref 0.1–1.0)
Monocytes Relative: 6 %
Neutro Abs: 5.6 10*3/uL (ref 1.7–7.7)
Neutrophils Relative %: 67 %
Platelets: 303 10*3/uL (ref 150–400)
RBC: 4 MIL/uL (ref 3.87–5.11)
RDW: 13.5 % (ref 11.5–15.5)
WBC: 8.4 10*3/uL (ref 4.0–10.5)
nRBC: 0 % (ref 0.0–0.2)

## 2021-06-11 LAB — BASIC METABOLIC PANEL
Anion gap: 8 (ref 5–15)
BUN: 12 mg/dL (ref 6–20)
CO2: 25 mmol/L (ref 22–32)
Calcium: 9 mg/dL (ref 8.9–10.3)
Chloride: 103 mmol/L (ref 98–111)
Creatinine, Ser: 0.6 mg/dL (ref 0.44–1.00)
GFR, Estimated: >60 mL/min (ref >60)
Glucose, Bld: 107 mg/dL — ABNORMAL HIGH (ref 70–99)
Potassium: 3.7 mmol/L (ref 3.5–5.1)
Sodium: 136 mmol/L (ref 135–145)

## 2021-06-11 LAB — TSH: TSH: 2.306 u[IU]/mL (ref 0.350–4.500)

## 2021-06-11 LAB — HCG, SERUM, QUALITATIVE: Preg, Serum: NEGATIVE

## 2021-06-11 NOTE — ED Provider Notes (Signed)
[facility] Provider Note: Rachel Dell, MD, FACEP  CSN: [id number] MRN: [id number] ARRIVAL: 06/11/21 at 0325 ROOM: MH09/[facility]09   CHIEF COMPLAINT  Palpitations   HISTORY OF PRESENT ILLNESS  06/11/21 3:51 AM Rachel Soto is a 45 y.o. female with about a 1 week history of intermittent periods of palpitations.  The palpitations are characterized as her heart beating rapidly with a sense of squeezing around her chest and a change in her breathing (she states it feels like she needs to breathe slower).  Nothing seems to trigger these events or make them better but she does attribute it to stress associated with working long hours.  She is not having symptoms currently.  She was seen in the ED on 06/05/2021 and was evaluated for waxing and waning left-sided chest discomfort for several months.  It was not exertional.  Work-up showed normal troponins (2 and 3) and a normal D-dimer and EKG.  She has also been having an increase in headaches recently.  She has never been formally diagnosed with migraines but she has been getting headaches on the top of her head associated with nausea.  These usually respond well to Advil.  She sometimes awakens with sweating of the head despite her room not being hot.   Past Medical History:  Diagnosis Date   Asthma     Past Surgical History:  Procedure Laterality Date   CESAREAN SECTION      Family History  Problem Relation Age of Onset   Diabetes Mother    Fibromyalgia Mother    Hepatitis C Mother     Social History   Tobacco Use   Smoking status: Never   Smokeless tobacco: Never  Substance Use Topics   Alcohol use: No   Drug use: No    Prior to Admission medications   Medication Sig Start Date End Date Taking? Authorizing Provider  loratadine (CLARITIN) 10 MG tablet Take 10 mg by mouth daily.    [provider]  ondansetron (ZOFRAN ODT) 4 MG disintegrating tablet Take 1 tablet (4 mg total) by mouth every 8  (eight) hours as needed for up to 10 doses for nausea or vomiting. 09/01/20   Sabino Donovan, MD    Allergies Patient has no known allergies.   REVIEW OF SYSTEMS  Negative except as noted here or in the History of Present Illness.   PHYSICAL EXAMINATION  Initial Vital Signs Blood pressure (!) 141/69, pulse 75, temperature 97.6 F (36.4 C), temperature source Oral, resp. rate 13, height 5\' 3"  (1.6 m), weight (!) 139.7 kg, SpO2 99 %.  Examination General: Well-developed, well-nourished female in no acute distress; appearance consistent with age of record HENT: normocephalic; atraumatic Eyes: pupils equal, round and reactive to light; extraocular muscles intact Neck: supple Heart: regular rate and rhythm; no ectopy Lungs: clear to auscultation bilaterally Abdomen: soft; nondistended; nontender; bowel sounds present Extremities: No deformity; full range of motion; pulses normal Neurologic: Awake, alert and oriented; motor function intact in all extremities and symmetric; no facial droop Skin: Warm and dry Psychiatric: Normal mood and affect   RESULTS  Summary of this visit's results, reviewed and interpreted by myself:   EKG Interpretation  Date/Time:  Sunday June 11 2021 03:37:16 EST Ventricular Rate:  68 PR Interval:  143 QRS Duration: 93 QT Interval:  406 QTC Calculation: 432 R Axis:   78 Text Interpretation: Sinus rhythm Normal ECG No significant change was found Confirmed by Enis Riecke ([id number]) on 06/11/2021 3:40:59 AM  Laboratory Studies: Results for orders placed or performed during the hospital encounter of 06/11/21 (from the past 24 hour(s))  CBC with Differential/Platelet     Status: Abnormal   Collection Time: 06/11/21  3:43 AM  Result Value Ref Range   WBC 8.4 4.0 - 10.5 K/uL   RBC 4.00 3.87 - 5.11 MIL/uL   Hemoglobin 11.4 (L) 12.0 - 15.0 g/dL   HCT 33.2 (L) 95.1 - 88.4 %   MCV 85.5 80.0 - 100.0 fL   MCH 28.5 26.0 - 34.0 pg   MCHC 33.3 30.0 -  36.0 g/dL   RDW 16.6 06.3 - 01.6 %   Platelets 303 150 - 400 K/uL   nRBC 0.0 0.0 - 0.2 %   Neutrophils Relative % 67 %   Neutro Abs 5.6 1.7 - 7.7 K/uL   Lymphocytes Relative 25 %   Lymphs Abs 2.1 0.7 - 4.0 K/uL   Monocytes Relative 6 %   Monocytes Absolute 0.5 0.1 - 1.0 K/uL   Eosinophils Relative 2 %   Eosinophils Absolute 0.2 0.0 - 0.5 K/uL   Basophils Relative 0 %   Basophils Absolute 0.0 0.0 - 0.1 K/uL   Immature Granulocytes 0 %   Abs Immature Granulocytes 0.02 0.00 - 0.07 K/uL  Basic metabolic panel     Status: Abnormal   Collection Time: 06/11/21  3:43 AM  Result Value Ref Range   Sodium 136 135 - 145 mmol/L   Potassium 3.7 3.5 - 5.1 mmol/L   Chloride 103 98 - 111 mmol/L   CO2 25 22 - 32 mmol/L   Glucose, Bld 107 (H) 70 - 99 mg/dL   BUN 12 6 - 20 mg/dL   Creatinine, Ser 0.10 0.44 - 1.00 mg/dL   Calcium 9.0 8.9 - 93.2 mg/dL   GFR, Estimated >35 >57 mL/min   Anion gap 8 5 - 15  hCG, serum, qualitative     Status: None   Collection Time: 06/11/21  3:43 AM  Result Value Ref Range   Preg, Serum NEGATIVE NEGATIVE   Imaging Studies: No results found.  ED COURSE and MDM  Nursing notes, initial and subsequent vitals signs, including pulse oximetry, reviewed and interpreted by myself.  Vitals:   06/11/21 0334 06/11/21 0339 06/11/21 0344 06/11/21 0445  BP:    127/70  Pulse:  75  61  Resp:  13  (!) 21  Temp:   97.6 F (36.4 C)   TempSrc:   Oral   SpO2:  99%  97%  Weight: (!) 139.7 kg     Height: 5\' 3"  (1.6 m)      Medications - No data to display  5:17 AM The rhythm strip for the patient's entire time on the monitor was reviewed and she had no arrhythmia or ectopy at any time.  She was advised that she may be indeed be having an arrhythmia such as SVT or PVCs but we were not able to capture these on either visit to the ED.  We will refer to cardiology as she may benefit from wearing a monitor to capture the events she is experiencing.  PROCEDURES   Procedures   ED DIAGNOSES     ICD-10-CM   1. Palpitations  R00.2          Mikesha Migliaccio, , MD 06/11/21 [phone]

## 2021-06-11 NOTE — ED Triage Notes (Signed)
Pt states for about a week she has had intermittent periods of "my heart going too fast" and "something squeezing around my chest". Pt able to walk from lobby to tx room. She is able to speak in clear complete sentences and appears to be in NAD.

## 2021-06-15 ENCOUNTER — Encounter (HOSPITAL_BASED_OUTPATIENT_CLINIC_OR_DEPARTMENT_OTHER): Payer: Self-pay | Admitting: Emergency Medicine

## 2021-06-15 ENCOUNTER — Emergency Department (HOSPITAL_BASED_OUTPATIENT_CLINIC_OR_DEPARTMENT_OTHER): Payer: Medicaid Other | Admitting: Radiology

## 2021-06-15 ENCOUNTER — Other Ambulatory Visit: Payer: Self-pay

## 2021-06-15 DIAGNOSIS — U071 COVID-19: Secondary | ICD-10-CM | POA: Insufficient documentation

## 2021-06-15 DIAGNOSIS — J45909 Unspecified asthma, uncomplicated: Secondary | ICD-10-CM | POA: Insufficient documentation

## 2021-06-15 NOTE — ED Triage Notes (Signed)
°  Patient comes in with sore throat and cough that has been going on since Saturday.  Patient states she has had an off/on fever for a few days and noticed she lost her sense of smell today while christmas shopping.  Patient has been taking motrin, cepacol drops, and robitussin at home.  Last dose was robitussin at 1800, motrin earlier in the morning.  Patient endorses diarrhea when she eats anything.  No nausea.  Pain 6/10.

## 2021-06-16 ENCOUNTER — Emergency Department (HOSPITAL_BASED_OUTPATIENT_CLINIC_OR_DEPARTMENT_OTHER)
Admission: EM | Admit: 2021-06-16 | Discharge: 2021-06-16 | Disposition: A | Discharge status: Home / Self Care | Payer: Medicaid Other | Attending: Emergency Medicine | Admitting: Emergency Medicine

## 2021-06-16 DIAGNOSIS — U071 COVID-19: Secondary | ICD-10-CM

## 2021-06-16 LAB — RESP PANEL BY RT-PCR (FLU A&B, COVID) ARPGX2
Influenza A by PCR: NEGATIVE
Influenza B by PCR: NEGATIVE
SARS Coronavirus 2 by RT PCR: POSITIVE — AB

## 2021-06-16 MED ORDER — NIRMATRELVIR/RITONAVIR (PAXLOVID)TABLET: 3.0000 | ORAL_TABLET | Freq: Two times a day (BID) | ORAL | 0 refills | Status: AC

## 2021-06-16 NOTE — ED Provider Notes (Signed)
[place] EMERGENCY DEPT Provider Note   CSN: [id number] Arrival date & time: 06/15/21  2324     History Chief Complaint  Patient presents with   Sore Throat   Cough    Rachel Soto is a 45 y.o. female.  Patient is a 45 year old female with a history of asthma who presents with cough and cold symptoms.  The symptoms started 4 days ago.  She has had some intermittent fevers.  Today she lost her sense of smell and taste.  She has no shortness of breath.  No nausea or vomiting.  She has had some diarrhea.  No associated chest pain.      Past Medical History:  Diagnosis Date   Asthma     There are no problems to display for this patient.   Past Surgical History:  Procedure Laterality Date   CESAREAN SECTION       OB History   No obstetric history on file.     Family History  Problem Relation Age of Onset   Diabetes Mother    Fibromyalgia Mother    Hepatitis C Mother     Social History   Tobacco Use   Smoking status: Never   Smokeless tobacco: Never  Substance Use Topics   Alcohol use: No   Drug use: No    Home Medications Prior to Admission medications   Medication Sig Start Date End Date Taking? Authorizing Provider  nirmatrelvir/ritonavir EUA (PAXLOVID) 20 x 150 MG & 10 x 100MG  TABS Take 3 tablets by mouth 2 (two) times daily for 5 days. Patient GFR is >60. Take nirmatrelvir (150 mg) two tablets twice daily for 5 days and ritonavir (100 mg) one tablet twice daily for 5 days. 06/16/21 06/21/21 Yes Malvin Johns, MD  loratadine (CLARITIN) 10 MG tablet Take 10 mg by mouth daily.    [provider]  ondansetron (ZOFRAN ODT) 4 MG disintegrating tablet Take 1 tablet (4 mg total) by mouth every 8 (eight) hours as needed for up to 10 doses for nausea or vomiting. 09/01/20   Breck Coons, MD    Allergies    Patient has no known allergies.  Review of Systems   Review of Systems  Constitutional:  Positive for fatigue and fever.  Negative for chills and diaphoresis.  HENT:  Positive for congestion and rhinorrhea. Negative for sneezing.   Eyes: Negative.   Respiratory:  Positive for cough. Negative for chest tightness and shortness of breath.   Cardiovascular:  Negative for chest pain and leg swelling.  Gastrointestinal:  Positive for diarrhea. Negative for abdominal pain, blood in stool, nausea and vomiting.  Genitourinary:  Negative for difficulty urinating, flank pain, frequency and hematuria.  Musculoskeletal:  Negative for arthralgias and back pain.  Skin:  Negative for rash.  Neurological:  Positive for headaches. Negative for dizziness, speech difficulty, weakness and numbness.   Physical Exam Updated Vital Signs BP (!) 152/106 (BP Location: Right Arm)    Pulse 72    Temp 97.9 F (36.6 C) (Oral)    Resp 20    Ht 5\' 3"  (1.6 m)    Wt (!) 139.7 kg    LMP 06/07/2021    SpO2 100%    BMI 54.56 kg/m   Physical Exam Constitutional:      Appearance: She is well-developed.  HENT:     Head: Normocephalic and atraumatic.  Eyes:     Pupils: Pupils are equal, round, and reactive to light.  Neck:  Comments: No meningismus Cardiovascular:     Rate and Rhythm: Normal rate and regular rhythm.     Heart sounds: Normal heart sounds.  Pulmonary:     Effort: Pulmonary effort is normal. No respiratory distress.     Breath sounds: Normal breath sounds. No wheezing or rales.  Chest:     Chest wall: No tenderness.  Abdominal:     General: Bowel sounds are normal.     Palpations: Abdomen is soft.     Tenderness: There is no abdominal tenderness. There is no guarding or rebound.  Musculoskeletal:        General: Normal range of motion.     Cervical back: Normal range of motion and neck supple.  Lymphadenopathy:     Cervical: No cervical adenopathy.  Skin:    General: Skin is warm and dry.     Findings: No rash.  Neurological:     Mental Status: She is alert and oriented to person, place, and time.    ED Results  / Procedures / Treatments   Labs (all labs ordered are listed, but only abnormal results are displayed) Labs Reviewed  RESP PANEL BY RT-PCR (FLU A&B, COVID) ARPGX2 - Abnormal; Notable for the following components:      Result Value   SARS Coronavirus 2 by RT PCR POSITIVE (*)    All other components within normal limits    EKG None  Radiology DG Chest 2 View  Result Date: 06/16/2021 CLINICAL DATA:  Sore throat and cough for several days, initial encounter EXAM: CHEST - 2 VIEW COMPARISON:  06/05/2021 FINDINGS: The heart size and mediastinal contours are within normal limits. Both lungs are clear. The visualized skeletal structures are unremarkable. IMPRESSION: No active cardiopulmonary disease. Electronically Signed   By: Alcide Clever M.D.   On: 06/16/2021 00:07    Procedures Procedures   Medications Ordered in ED Medications - No data to display  ED Course  I have reviewed the triage vital signs and the nursing notes.  Pertinent labs & imaging results that were available during my care of the patient were reviewed by me and considered in my medical decision making (see chart for details).    MDM Rules/Calculators/A&P                         Patient presents with URI symptoms.  Her chest x-ray is clear without evidence of pneumonia.  She is otherwise well-appearing.  Her COVID test is positive.  She has no shortness of breath or hypoxia.  Given her underlying asthma, was offered Paxlovid.  She was discharged home in good condition.  Return precautions and symptomatic care instructions were given.    Final Clinical Impression(s) / ED Diagnoses Final diagnoses:  COVID-19 virus infection    Rx / DC Orders ED Discharge Orders          Ordered    nirmatrelvir/ritonavir EUA (PAXLOVID) 20 x 150 MG & 10 x 100MG  TABS  2 times daily        06/16/21 0217             06/18/21, MD 06/16/21 [phone]

## 2021-08-19 ENCOUNTER — Encounter (HOSPITAL_BASED_OUTPATIENT_CLINIC_OR_DEPARTMENT_OTHER): Payer: Self-pay

## 2021-08-19 ENCOUNTER — Emergency Department (HOSPITAL_BASED_OUTPATIENT_CLINIC_OR_DEPARTMENT_OTHER): Payer: Medicaid Other

## 2021-08-19 ENCOUNTER — Emergency Department (HOSPITAL_BASED_OUTPATIENT_CLINIC_OR_DEPARTMENT_OTHER)
Admission: EM | Admit: 2021-08-19 | Discharge: 2021-08-19 | Disposition: A | Discharge status: Home / Self Care | Payer: Medicaid Other | Attending: Emergency Medicine | Admitting: Emergency Medicine

## 2021-08-19 ENCOUNTER — Other Ambulatory Visit: Payer: Self-pay

## 2021-08-19 DIAGNOSIS — J069 Acute upper respiratory infection, unspecified: Secondary | ICD-10-CM

## 2021-08-19 DIAGNOSIS — R110 Nausea: Secondary | ICD-10-CM | POA: Insufficient documentation

## 2021-08-19 DIAGNOSIS — Z79899 Other long term (current) drug therapy: Secondary | ICD-10-CM | POA: Insufficient documentation

## 2021-08-19 DIAGNOSIS — R519 Headache, unspecified: Secondary | ICD-10-CM

## 2021-08-19 DIAGNOSIS — Z8616 Personal history of COVID-19: Secondary | ICD-10-CM | POA: Insufficient documentation

## 2021-08-19 DIAGNOSIS — Z20822 Contact with and (suspected) exposure to covid-19: Secondary | ICD-10-CM | POA: Insufficient documentation

## 2021-08-19 DIAGNOSIS — R0789 Other chest pain: Secondary | ICD-10-CM | POA: Insufficient documentation

## 2021-08-19 DIAGNOSIS — J45909 Unspecified asthma, uncomplicated: Secondary | ICD-10-CM | POA: Insufficient documentation

## 2021-08-19 DIAGNOSIS — R030 Elevated blood-pressure reading, without diagnosis of hypertension: Secondary | ICD-10-CM | POA: Insufficient documentation

## 2021-08-19 LAB — BASIC METABOLIC PANEL
Anion gap: 7 (ref 5–15)
BUN: 9 mg/dL (ref 6–20)
CO2: 26 mmol/L (ref 22–32)
Calcium: 8.9 mg/dL (ref 8.9–10.3)
Chloride: 105 mmol/L (ref 98–111)
Creatinine, Ser: 0.41 mg/dL — ABNORMAL LOW (ref 0.44–1.00)
GFR, Estimated: >60 mL/min (ref >60)
Glucose, Bld: 97 mg/dL (ref 70–99)
Potassium: 3.9 mmol/L (ref 3.5–5.1)
Sodium: 138 mmol/L (ref 135–145)

## 2021-08-19 LAB — CBC
HCT: 34.2 % — ABNORMAL LOW (ref 36.0–46.0)
Hemoglobin: 11 g/dL — ABNORMAL LOW (ref 12.0–15.0)
MCH: 27.6 pg (ref 26.0–34.0)
MCHC: 32.2 g/dL (ref 30.0–36.0)
MCV: 85.7 fL (ref 80.0–100.0)
Platelets: 300 10*3/uL (ref 150–400)
RBC: 3.99 MIL/uL (ref 3.87–5.11)
RDW: 14.2 % (ref 11.5–15.5)
WBC: 9 10*3/uL (ref 4.0–10.5)
nRBC: 0 % (ref 0.0–0.2)

## 2021-08-19 LAB — RESP PANEL BY RT-PCR (FLU A&B, COVID) ARPGX2
Influenza A by PCR: NEGATIVE
Influenza B by PCR: NEGATIVE
SARS Coronavirus 2 by RT PCR: NEGATIVE

## 2021-08-19 LAB — TROPONIN I (HIGH SENSITIVITY): Troponin I (High Sensitivity): 2 ng/L (ref <18)

## 2021-08-19 MED ORDER — SODIUM CHLORIDE 0.9 % IV BOLUS
1000.0000 mL | Freq: Once | INTRAVENOUS | Status: AC
Start: 2021-08-19 — End: 2021-08-19
  Administered 2021-08-19: 1000 mL via INTRAVENOUS

## 2021-08-19 MED ORDER — KETOROLAC TROMETHAMINE 30 MG/ML IJ SOLN
30.0000 mg | Freq: Once | INTRAMUSCULAR | Status: AC
  Administered 2021-08-19: 30 mg via INTRAVENOUS
  Filled 2021-08-19: qty 1

## 2021-08-19 MED ORDER — DIPHENHYDRAMINE HCL 50 MG/ML IJ SOLN
25.0000 mg | Freq: Once | INTRAMUSCULAR | Status: AC
  Administered 2021-08-19: 25 mg via INTRAVENOUS
  Filled 2021-08-19: qty 1

## 2021-08-19 MED ORDER — METOCLOPRAMIDE HCL 5 MG/ML IJ SOLN
10.0000 mg | Freq: Once | INTRAMUSCULAR | Status: AC
  Administered 2021-08-19: 10 mg via INTRAVENOUS
  Filled 2021-08-19: qty 2

## 2021-08-19 NOTE — ED Triage Notes (Signed)
Pt arrives POV with c/o severe headache starting this morning.  Pt states she has had cough and congestion for 4 days, green tinged phlegm.  Denies fevers.  Took blood pressure at home, PTA, 171/107.  NAD noted in triage.

## 2021-08-19 NOTE — Discharge Instructions (Addendum)
You tested negative for COVID/Flu. You likely have an Upper Respiratory infection that is causing your symptoms. I recommend treating congestion with flonase and sudafed at home. If your symptoms do not improve after a week from start date or if they worsen, please return to be reevaluated.

## 2021-08-19 NOTE — ED Provider Notes (Signed)
[facility] Provider Note   CSN: [id number] Arrival date & time: 08/19/21  1420     History  Chief Complaint  Patient presents with   Headache    Rachel Soto is a 46 y.o. female. PMH of asthma.  Presents with 4 days of upper respiratory symptoms.  She is having cough, congestion, and mild sore throat.  Congestion is as below back that she is having sinus headache that feels like pounding behind both eyes.  She has associated nausea with this.  She also feels like she has bilateral ear congestion.  She states that she has a productive cough and feels like she is congested inside of her throat and chest and describes left-sided chest pain.  She says that this is constant.  She also is concerned because her BP has been elevated as well.     Headache Associated symptoms: congestion, cough, ear pain, nausea and sore throat       Home Medications Prior to Admission medications   Medication Sig Start Date End Date Taking? Authorizing Provider  loratadine (CLARITIN) 10 MG tablet Take 10 mg by mouth daily.    [provider]  ondansetron (ZOFRAN ODT) 4 MG disintegrating tablet Take 1 tablet (4 mg total) by mouth every 8 (eight) hours as needed for up to 10 doses for nausea or vomiting. 09/01/20   Breck Coons, MD      Allergies    Patient has no known allergies.    Review of Systems   Review of Systems  HENT:  Positive for congestion, ear pain and sore throat.   Respiratory:  Positive for cough.   Cardiovascular:  Positive for chest pain.  Gastrointestinal:  Positive for nausea.  Neurological:  Positive for headaches.  All other systems reviewed and are negative.  Physical Exam Updated Vital Signs BP (!) 120/55 (BP Location: Right Arm)    Pulse (!) 54    Temp 98.4 F (36.9 C)    Resp 18    Ht 5\' 3"  (1.6 m)    Wt (!) 137.4 kg    LMP 07/27/2021 (Exact Date)    SpO2 100%    BMI 53.67 kg/m  Physical Exam Vitals and nursing note reviewed.   Constitutional:      General: She is not in acute distress.    Appearance: Normal appearance. She is not ill-appearing, toxic-appearing or diaphoretic.  HENT:     Head: Normocephalic and atraumatic.     Right Ear: Tympanic membrane, ear canal and external ear normal. There is no impacted cerumen.     Left Ear: Tympanic membrane, ear canal and external ear normal. There is no impacted cerumen.     Nose: Congestion present. No nasal deformity.     Mouth/Throat:     Lips: Pink. No lesions.     Mouth: Mucous membranes are moist. No injury, lacerations, oral lesions or angioedema.     Pharynx: Oropharynx is clear. Uvula midline. No pharyngeal swelling, oropharyngeal exudate, posterior oropharyngeal erythema or uvula swelling.  Eyes:     General: Gaze aligned appropriately. No scleral icterus.       Right eye: No discharge.        Left eye: No discharge.     Conjunctiva/sclera: Conjunctivae normal.     Right eye: Right conjunctiva is not injected. No exudate or hemorrhage.    Left eye: Left conjunctiva is not injected. No exudate or hemorrhage.    Pupils: Pupils are equal, round, and reactive  to light.  Cardiovascular:     Rate and Rhythm: Normal rate and regular rhythm.     Pulses: Normal pulses.          Radial pulses are 2+ on the right side and 2+ on the left side.       Dorsalis pedis pulses are 2+ on the right side and 2+ on the left side.     Heart sounds: Normal heart sounds, S1 normal and S2 normal. Heart sounds not distant. No murmur heard.   No friction rub. No gallop. No S3 or S4 sounds.  Pulmonary:     Effort: Pulmonary effort is normal. No accessory muscle usage or respiratory distress.     Breath sounds: Normal breath sounds. No stridor. No wheezing, rhonchi or rales.  Chest:     Chest wall: No tenderness.  Abdominal:     General: Abdomen is flat. Bowel sounds are normal. There is no distension.     Palpations: Abdomen is soft. There is no mass or pulsatile mass.      Tenderness: There is no abdominal tenderness. There is no guarding or rebound.  Musculoskeletal:     Right lower leg: No edema.     Left lower leg: No edema.  Skin:    General: Skin is warm and dry.     Coloration: Skin is not jaundiced or pale.     Findings: No bruising, erythema, lesion or rash.  Neurological:     General: No focal deficit present.     Mental Status: She is alert and oriented to person, place, and time.     GCS: GCS eye subscore is 4. GCS verbal subscore is 5. GCS motor subscore is 6.  Psychiatric:        Mood and Affect: Mood normal.        Behavior: Behavior normal. Behavior is cooperative.    ED Results / Procedures / Treatments   Labs (all labs ordered are listed, but only abnormal results are displayed) Labs Reviewed  BASIC METABOLIC PANEL - Abnormal; Notable for the following components:      Result Value   Creatinine, Ser 0.41 (*)    All other components within normal limits  CBC - Abnormal; Notable for the following components:   Hemoglobin 11.0 (*)    HCT 34.2 (*)    All other components within normal limits  RESP PANEL BY RT-PCR (FLU A&B, COVID) ARPGX2  TROPONIN I (HIGH SENSITIVITY)    EKG EKG Interpretation  Date/Time:  Saturday August 19 2021 16:08:37 EST Ventricular Rate:  57 PR Interval:  173 QRS Duration: 98 QT Interval:  454 QTC Calculation: 443 R Axis:   64 Text Interpretation: Sinus rhythm since last tracing no significant change Confirmed by Malvin Johns [phone]) on 08/19/2021 7:01:49 PM  Radiology DG Chest Port 1 View  Result Date: 08/19/2021 CLINICAL DATA:  Chest pain EXAM: PORTABLE CHEST 1 VIEW COMPARISON:  06/15/2021 FINDINGS: The heart size and mediastinal contours are within normal limits. Both lungs are clear. The visualized skeletal structures are unremarkable. IMPRESSION: No active disease. Electronically Signed   By: Elmer Picker M.D.   On: 08/19/2021 16:33    Procedures Procedures   Medications Ordered in  ED Medications  sodium chloride 0.9 % bolus 1,000 mL ( Intravenous Stopped 08/19/21 1736)  metoCLOPramide (REGLAN) injection 10 mg (10 mg Intravenous Given 08/19/21 1639)  diphenhydrAMINE (BENADRYL) injection 25 mg (25 mg Intravenous Given 08/19/21 1639)  ketorolac (TORADOL) 30 MG/ML injection 30 mg (  30 mg Intravenous Given 08/19/21 1639)  diphenhydrAMINE (BENADRYL) injection 25 mg (25 mg Intravenous Given 08/19/21 1705)    ED Course/ Medical Decision Making/ A&P                           Medical Decision Making Problems Addressed: Acute intractable headache, unspecified headache type: acute illness or injury with systemic symptoms Elevated blood pressure reading: chronic illness or injury Viral upper respiratory tract infection: self-limited or minor problem  Amount and/or Complexity of Data Reviewed External Data Reviewed: labs, ECG and notes. Labs: ordered. Decision-making details documented in ED Course. Radiology: ordered and independent interpretation performed. Decision-making details documented in ED Course. ECG/medicine tests: ordered and independent interpretation performed. Decision-making details documented in ED Course.  Risk Prescription drug management.   This is a 46 y.o. female with a pertinent PMH of asthma who presents to the ED with URI sx, headache, and HTN.  Initial Impression: I think headache is likely 2/2 to sinus congestion, but she also has sx suggestive of a migraine so will treat with migraine cocktail. I think chest pain is likely due to congestion, but since she has been hypertensive, will do labs and trops to r/o hypertensive emergency. She is hypertensive to 183/93 here.   Ddx: sinusitis, viral URI, PNA, Bronchitis, hypertensive emergency, ACS, viral pericarditis, etc.   Additional History:  Additional history obtained from patient External records from outside source obtained and reviewed including multiple recent ED visits Social Determinants of  Health: n/a    I personally reviewed and interpreted all laboratory work and imaging . I agree with radiologist interpretation. Abnormal results outlined below. Hgb 11 (stable), trop negative, other labs reassuring.  CXR unremarkable EKG NSR  Cardiac Monitoring: The patient was maintained on a cardiac monitor.  I personally viewed and interpreted the cardiac monitored which showed an underlying rhythm of: NSR  Medications:   I ordered medication including headache cocktail, IVF  for headache Reevaluation of the patient after these medicines showed that the patient resolved I have reviewed the patients home medicines and have made adjustments as needed  My Impression: Patient likely has a URI. No suggestion of PNA. No evidence of end organ damage from elevated BP. EKG not suggestive of pericarditis or ACS. I think headache was likely mixture of migraine and sinus headache. Symptoms have resolved here. Recommend supportive tx at home.    Disposition:  After consideration of the diagnostic results and the patients response to treatment, I feel that the patent would benefit from supportive tx at home.  Portions of this note were generated with [employer]. Dictation errors may occur despite best attempts at proofreading.    Final Clinical Impression(s) / ED Diagnoses Final diagnoses:  Viral upper respiratory tract infection  Acute intractable headache, unspecified headache type  Elevated blood pressure reading    Rx / DC Orders ED Discharge Orders     None         Adolphus Birchwood, PA-C 08/20/21 0017    Malvin Johns, MD 08/20/21 1459

## 2021-08-23 ENCOUNTER — Emergency Department (HOSPITAL_BASED_OUTPATIENT_CLINIC_OR_DEPARTMENT_OTHER)
Admission: EM | Admit: 2021-08-23 | Discharge: 2021-08-23 | Disposition: A | Discharge status: Home / Self Care | Payer: Medicaid Other | Attending: Emergency Medicine | Admitting: Emergency Medicine

## 2021-08-23 ENCOUNTER — Emergency Department (HOSPITAL_BASED_OUTPATIENT_CLINIC_OR_DEPARTMENT_OTHER): Payer: Medicaid Other | Admitting: Radiology

## 2021-08-23 ENCOUNTER — Encounter (HOSPITAL_BASED_OUTPATIENT_CLINIC_OR_DEPARTMENT_OTHER): Payer: Self-pay

## 2021-08-23 ENCOUNTER — Other Ambulatory Visit: Payer: Self-pay

## 2021-08-23 DIAGNOSIS — J40 Bronchitis, not specified as acute or chronic: Secondary | ICD-10-CM

## 2021-08-23 DIAGNOSIS — J45909 Unspecified asthma, uncomplicated: Secondary | ICD-10-CM | POA: Insufficient documentation

## 2021-08-23 MED ORDER — PREDNISONE 10 MG PO TABS: 20.0000 mg | ORAL_TABLET | Freq: Every day | ORAL | 0 refills | Status: AC

## 2021-08-23 MED ORDER — BENZONATATE 100 MG PO CAPS: 100.0000 mg | ORAL_CAPSULE | Freq: Three times a day (TID) | ORAL | 0 refills | Status: DC

## 2021-08-23 MED ORDER — PREDNISONE 20 MG PO TABS
20.0000 mg | ORAL_TABLET | Freq: Once | ORAL | Status: AC
  Administered 2021-08-23: 20 mg via ORAL
  Filled 2021-08-23: qty 1

## 2021-08-23 NOTE — ED Provider Notes (Signed)
? ?  Emergency Department Provider Note ? ? ?I have reviewed the triage vital signs and the nursing notes. ? ?See other provider note. Duplicate. ? ?Procedures ? ? ? ?  ?Maia Plan, MD ?09/01/21 0825 ? ?

## 2021-08-23 NOTE — Discharge Instructions (Signed)
You were seen in the emergency room today with bronchitis symptoms.  I am starting you on a short course of steroid and have called in some cough pills to your pharmacy.  I will give you the first dose of steroid tonight.  Please continue using your albuterol as needed.  With your elevated blood pressure, you can discuss with the pharmacist regarding over-the-counter medications that are good to take with high blood pressure. ?

## 2021-08-23 NOTE — ED Provider Notes (Signed)
? ?Emergency Department Provider Note ? ? ?I have reviewed the triage vital signs and the nursing notes. ? ? ?HISTORY ? ?Chief Complaint ?Shortness of Breath ? ? ?HPI ?Rachel Soto is a 46 y.o. female with PMH of asthma presents to the ED with SOB with productive cough. Symptoms developing over the last week. No CP. No fever. She was seen in the ED on 2/25 and compliant with medication but symptoms continue. No radiation of symptoms or modifying factors.  ? ? ?Past Medical History:  ?Diagnosis Date  ? Asthma   ? ? ?Review of Systems ? ?Constitutional: No fever/chills ?Eyes: No visual changes. ?ENT: No sore throat. ?Cardiovascular: Denies chest pain. ?Respiratory: Positive shortness of breath and cough.  ?Gastrointestinal: No abdominal pain.  No nausea, no vomiting.  No diarrhea.  No constipation. ?Genitourinary: Negative for dysuria. ?Musculoskeletal: Negative for back pain. ?Skin: Negative for rash. ?Neurological: Negative for headaches, focal weakness or numbness. ? ?____________________________________________ ? ? ?PHYSICAL EXAM: ? ?VITAL SIGNS: ?ED Triage Vitals  ?Enc Vitals Group  ?   BP 08/23/21 2103 (!) 151/97  ?   Pulse Rate 08/23/21 2103 76  ?   Resp 08/23/21 2103 18  ?   Temp 08/23/21 2103 98.4 ?F (36.9 ?C)  ?   Temp Source 08/23/21 2103 Oral  ?   SpO2 08/23/21 2103 100 %  ?   Weight 08/23/21 2102 (!) 303 lb (137.4 kg)  ?   Height 08/23/21 2102 5\' 3"  (1.6 m)  ? ?Constitutional: Alert and oriented. Well appearing and in no acute distress. ?Eyes: Conjunctivae are normal. ?Head: Atraumatic. ?Nose: No congestion/rhinnorhea. ?Mouth/Throat: Mucous membranes are moist.   ?Neck: No stridor.  ?Cardiovascular: Normal rate, regular rhythm. Good peripheral circulation. Grossly normal heart sounds.   ?Respiratory: Normal respiratory effort.  No retractions. Lungs CTAB. ?Gastrointestinal: No distention.  ?Musculoskeletal: No gross deformities of extremities. ?Neurologic:  Normal speech and language.  ?Skin:  Skin  is warm, dry and intact. No rash noted. ? ?____________________________________________ ? ? ?PROCEDURES ? ?Procedure(s) performed:  ? ?Procedures ? ?None ?____________________________________________ ? ? ?INITIAL IMPRESSION / ASSESSMENT AND PLAN / ED COURSE ? ?Pertinent labs & imaging results that were available during my care of the patient were reviewed by me and considered in my medical decision making (see chart for details). ?  ?This patient is Presenting for Evaluation of SOB, which does require a range of treatment options, and is a complaint that involves a high risk of morbidity and mortality. ? ?The Differential Diagnoses include viral infection, allergies, CAP, PE, ACS, CHF. ? ? ?I decided to review pertinent External Data, and in summary patient seen in the ED on 2/25 for similar symptoms. ?  ?Clinical Laboratory Tests: Considered labs but base on exam and presentation my overall suspicion for ACS, PE, or CHF is very low. Vitals are WNL other than elevated BP.  ? ?Radiologic Tests Ordered, included CXR. I independently interpreted the images and agree with radiology interpretation.  ? ?Social Determinants of Health Risk patient is not a smoker.  ? ?Medical Decision Making: Summary:  ?Patient returns to the ED with bronchitis type symptoms. CXR evaluated as above. Exam and presentation not consistent with asthma exacerbation, CHF, PE, or ACS. Plan for steroid course and OTC medication for symptoms. Will need PCP follow up regarding elevated BP but no findings to suspect HTN emergency.  ? ?Reevaluation with update and discussion with patient regarding CXR and treatment plan along with f/u plan regarding ED presentation and elevated BP.  PCP contact info provided in the AVS.  ? ?Disposition: discharge ? ?____________________________________________ ? ?FINAL CLINICAL IMPRESSION(S) / ED DIAGNOSES ? ?Final diagnoses:  ?Bronchitis  ? ? ? ?NEW OUTPATIENT MEDICATIONS STARTED DURING THIS VISIT: ? ?Discharge  Medication List as of 08/23/2021  9:57 PM  ?  ? ?START taking these medications  ? Details  ?benzonatate (TESSALON) 100 MG capsule Take 1 capsule (100 mg total) by mouth every 8 (eight) hours., Starting Wed 08/23/2021, Normal  ?  ?predniSONE (DELTASONE) 10 MG tablet Take 2 tablets (20 mg total) by mouth daily for 3 days., Starting Thu 08/24/2021, Until Sun 08/27/2021, Normal  ?  ?  ? ? ?Note:  This document was prepared using Dragon voice recognition software and may include unintentional dictation errors. ? ?Alona Bene, MD, FACEP ?Emergency Medicine ? ?  ?Maia Plan, MD ?09/01/21 [phone] ? ?

## 2021-08-23 NOTE — ED Triage Notes (Signed)
Shortness of breath for two days that becomes worse at night. Productive cough that worsens at night as well.  Was seen here 08/19/2021.  States symptoms are no better, but no worse. ?

## 2021-08-23 NOTE — ED Notes (Signed)
Pt verbalizes understanding of discharge instructions. Opportunity for questioning and answers were provided. Pt discharged from ED to home.   ? ?

## 2021-09-07 ENCOUNTER — Emergency Department (HOSPITAL_BASED_OUTPATIENT_CLINIC_OR_DEPARTMENT_OTHER)
Admission: EM | Admit: 2021-09-07 | Discharge: 2021-09-07 | Disposition: A | Discharge status: Home / Self Care | Payer: Self-pay | Attending: Emergency Medicine | Admitting: Emergency Medicine

## 2021-09-07 ENCOUNTER — Other Ambulatory Visit: Payer: Self-pay

## 2021-09-07 ENCOUNTER — Emergency Department (HOSPITAL_BASED_OUTPATIENT_CLINIC_OR_DEPARTMENT_OTHER): Payer: Self-pay | Admitting: Radiology

## 2021-09-07 DIAGNOSIS — R519 Headache, unspecified: Secondary | ICD-10-CM | POA: Insufficient documentation

## 2021-09-07 DIAGNOSIS — R030 Elevated blood-pressure reading, without diagnosis of hypertension: Secondary | ICD-10-CM | POA: Insufficient documentation

## 2021-09-07 DIAGNOSIS — R0789 Other chest pain: Secondary | ICD-10-CM | POA: Insufficient documentation

## 2021-09-07 LAB — CBC
HCT: 34.2 % — ABNORMAL LOW (ref 36.0–46.0)
Hemoglobin: 11.5 g/dL — ABNORMAL LOW (ref 12.0–15.0)
MCH: 28.3 pg (ref 26.0–34.0)
MCHC: 33.6 g/dL (ref 30.0–36.0)
MCV: 84.2 fL (ref 80.0–100.0)
Platelets: 299 10*3/uL (ref 150–400)
RBC: 4.06 MIL/uL (ref 3.87–5.11)
RDW: 13.7 % (ref 11.5–15.5)
WBC: 8.5 10*3/uL (ref 4.0–10.5)
nRBC: 0 % (ref 0.0–0.2)

## 2021-09-07 LAB — TROPONIN I (HIGH SENSITIVITY): Troponin I (High Sensitivity): 3 ng/L (ref <18)

## 2021-09-07 LAB — BASIC METABOLIC PANEL
Anion gap: 9 (ref 5–15)
BUN: 9 mg/dL (ref 6–20)
CO2: 24 mmol/L (ref 22–32)
Calcium: 8.9 mg/dL (ref 8.9–10.3)
Chloride: 105 mmol/L (ref 98–111)
Creatinine, Ser: 0.54 mg/dL (ref 0.44–1.00)
GFR, Estimated: >60 mL/min (ref >60)
Glucose, Bld: 132 mg/dL — ABNORMAL HIGH (ref 70–99)
Potassium: 3.9 mmol/L (ref 3.5–5.1)
Sodium: 138 mmol/L (ref 135–145)

## 2021-09-07 MED ORDER — HYDROCHLOROTHIAZIDE 25 MG PO TABS: 25.0000 mg | ORAL_TABLET | Freq: Every day | ORAL | 3 refills | Status: DC

## 2021-09-07 NOTE — Discharge Instructions (Signed)
I have sent a prescription for a blood pressure medication to your pharmacy.  Keep checking your blood pressure 1 time a day for the next couple of days.  If it is staying high then fill and start the prescription.  Try to find primary care for recheck. ?

## 2021-09-07 NOTE — ED Provider Notes (Addendum)
?[facility] EM[facility]PT ?Provider Note ? ? ?CSN: [id number] ?Arrival date & time: 09/07/21  0601 ? ?  ? ?History ? ?Chief Complaint  ?Patient presents with  ? Hypertension  ? ? ?Rachel Soto is a 46 y.o. female. ? ?Patient presents to the emergency department for multiple complaints.  Patient's primary complaint is elevated blood pressure.  Patient reports that she has been having a throbbing headache as well as chest pressure for the last week or so.  She has been checking her blood pressure at home and it has been consistently high, systolic pressures around 170-180. ? ? ?  ? ?Home Medications ?Prior to Admission medications   ?Medication Sig Start Date End Date Taking? Authorizing Provider  ?benzonatate (TESSALON) 100 MG capsule Take 1 capsule (100 mg total) by mouth every 8 (eight) hours. 08/23/21   Long, Arlyss Repress, MD  ?loratadine (CLARITIN) 10 MG tablet Take 10 mg by mouth daily.    [provider]  ?ondansetron (ZOFRAN ODT) 4 MG disintegrating tablet Take 1 tablet (4 mg total) by mouth every 8 (eight) hours as needed for up to 10 doses for nausea or vomiting. 09/01/20   Sabino Donovan, MD  ?   ? ?Allergies    ?Patient has no known allergies.   ? ?Review of Systems   ?Review of Systems  ?Cardiovascular:  Positive for chest pain.  ?Neurological:  Positive for headaches.  ? ?Physical Exam ?Updated Vital Signs ?BP 136/63   Pulse 78   Temp 98.4 ?F (36.9 ?C) (Oral)   Resp 20   Wt (!) 137.4 kg   SpO2 97%   BMI 53.67 kg/m?  ?Physical Exam ?Vitals and nursing note reviewed.  ?Constitutional:   ?   General: She is not in acute distress. ?   Appearance: She is well-developed. She is obese.  ?HENT:  ?   Head: Normocephalic and atraumatic.  ?   Mouth/Throat:  ?   Mouth: Mucous membranes are moist.  ?Eyes:  ?   General: Vision grossly intact. Gaze aligned appropriately.  ?   Extraocular Movements: Extraocular movements intact.  ?   Conjunctiva/sclera: Conjunctivae normal.  ?Cardiovascular:  ?    Rate and Rhythm: Normal rate and regular rhythm.  ?   Pulses: Normal pulses.  ?   Heart sounds: Normal heart sounds, S1 normal and S2 normal. No murmur heard. ?  No friction rub. No gallop.  ?Pulmonary:  ?   Effort: Pulmonary effort is normal. No respiratory distress.  ?   Breath sounds: Normal breath sounds.  ?Abdominal:  ?   General: Bowel sounds are normal.  ?   Palpations: Abdomen is soft.  ?   Tenderness: There is no abdominal tenderness. There is no guarding or rebound.  ?   Hernia: No hernia is present.  ?Musculoskeletal:     ?   General: No swelling.  ?   Cervical back: Full passive range of motion without pain, normal range of motion and neck supple. No spinous process tenderness or muscular tenderness. Normal range of motion.  ?   Right lower leg: No edema.  ?   Left lower leg: No edema.  ?Skin: ?   General: Skin is warm and dry.  ?   Capillary Refill: Capillary refill takes less than 2 seconds.  ?   Findings: No ecchymosis, erythema, rash or wound.  ?Neurological:  ?   General: No focal deficit present.  ?   Mental Status: She is alert and oriented to  person, place, and time.  ?   GCS: GCS eye subscore is 4. GCS verbal subscore is 5. GCS motor subscore is 6.  ?   Cranial Nerves: Cranial nerves 2-12 are intact.  ?   Sensory: Sensation is intact.  ?   Motor: Motor function is intact.  ?   Coordination: Coordination is intact.  ?Psychiatric:     ?   Attention and Perception: Attention normal.     ?   Mood and Affect: Mood normal.     ?   Speech: Speech normal.     ?   Behavior: Behavior normal.  ? ? ?ED Results / Procedures / Treatments   ?Labs ?(all labs ordered are listed, but only abnormal results are displayed) ?Labs Reviewed  ?CBC - Abnormal; Notable for the following components:  ?    Result Value  ? Hemoglobin 11.5 (*)   ? HCT 34.2 (*)   ? All other components within normal limits  ?BASIC METABOLIC PANEL  ?PREGNANCY, URINE  ?TROPONIN I (HIGH SENSITIVITY)  ? ? ?EKG ?EKG  Interpretation ? ?Date/Time:  Thursday September 07 2021 06:16:34 EDT ?Ventricular Rate:  81 ?PR Interval:  138 ?QRS Duration: 89 ?QT Interval:  380 ?QTC Calculation: 442 ?R Axis:   79 ?Text Interpretation: Sinus rhythm Normal ECG Confirmed by Gilda Crease [phone]) on 09/07/2021 6:32:02 AM ? ?Radiology ?No results found. ? ?Procedures ?Procedures  ? ? ?Medications Ordered in ED ?Medications - No data to display ? ?ED Course/ Medical Decision Making/ A&P ?  ?                        ?Medical Decision Making ?Amount and/or Complexity of Data Reviewed ?External Data Reviewed: notes. ?   Details: Blood pressure 151/97 at her last visit to the ED 2 weeks ago ?Labs: ordered. ?Radiology: ordered. ?ECG/medicine tests: ordered and independent interpretation performed. Decision-making details documented in ED Course. ? ?Risk ?Prescription drug management. ? ? ?Presents to the emergency department for elevated blood pressure and chest pressure. ? ?Differential diagnosis considered includes essential hypertension, hypertensive urgency, angina, NSTEMI, noncardiac chest pain, increased stress. ? ?Patient reports a throbbing in her head intermittently over the last week.  No sudden onset, no red flags.  She has a normal neurologic exam.  Does not have any indication for CT scan at this time. ? ?Patient reports persistently elevated blood pressure for the last couple of weeks.  I did confirm that her blood pressure was elevated at her last visit on March 1.  She has been seeing pressures as high as 1 70-1 80 at home.  Blood pressure 140s systolic here in the ED. ? ?EKG performed at arrival is normal.  Patient does have an increased BMI, otherwise no significant cardiac risk factors.  No shortness of breath.  Symptoms are intermittent, currently not present.  Doubt PE.  She did have 1 episode of mild tachycardia at arrival, suspect anxiety. ? ?Patient's symptoms do not seem to suggest acute coronary syndrome or NSTEMI.  She had a  similar presentation with cardiac work-up less than a month ago.  Symptoms have been ongoing for a week.  Normal EKG and normal troponin x1 is adequate work-up.  As she does not have access to primary care, will prescribe hydrochlorothiazide.  I suggested that the patient continue to check her blood pressures for a few more days before she initiates treatment.  If pressures are staying high she will start hydrochlorothiazide  and find primary care follow-up. ? ? ? ? ? ?Final Clinical Impression(s) / ED Diagnoses ?Final diagnoses:  ?Chest pain, unspecified type  ? ? ?Rx / DC Orders ?ED Discharge Orders   ? ? None  ? ?  ? ? ?  ?Gilda CreasePollina, Jhane Lorio J, MD ?09/07/21 [phone] ? ?  ?Gilda CreasePollina, Audry Pecina J, MD ?09/07/21 0720 ? ?

## 2021-09-07 NOTE — ED Triage Notes (Signed)
Presents for HTN, has been told that her BP is high in the past but does not have PCP to follow up with this issue, notes SBP 170s-180s at home. Assosciates these high pressures with chest pressure, diaphoresis and HAs. ? ?Has been taking 1000mg  (5 tabs) of Motrin at home for HA. Encouraged to reduce dose. ?

## 2021-11-04 ENCOUNTER — Other Ambulatory Visit: Payer: Self-pay

## 2021-11-04 DIAGNOSIS — I10 Essential (primary) hypertension: Secondary | ICD-10-CM | POA: Insufficient documentation

## 2021-11-04 DIAGNOSIS — G44209 Tension-type headache, unspecified, not intractable: Secondary | ICD-10-CM | POA: Insufficient documentation

## 2021-11-04 DIAGNOSIS — Z79899 Other long term (current) drug therapy: Secondary | ICD-10-CM | POA: Insufficient documentation

## 2021-11-04 DIAGNOSIS — J45909 Unspecified asthma, uncomplicated: Secondary | ICD-10-CM | POA: Insufficient documentation

## 2021-11-05 ENCOUNTER — Other Ambulatory Visit: Payer: Self-pay

## 2021-11-05 ENCOUNTER — Emergency Department (HOSPITAL_BASED_OUTPATIENT_CLINIC_OR_DEPARTMENT_OTHER)
Admission: EM | Admit: 2021-11-05 | Discharge: 2021-11-05 | Disposition: A | Discharge status: Home / Self Care | Payer: Medicaid Other | Attending: Emergency Medicine | Admitting: Emergency Medicine

## 2021-11-05 ENCOUNTER — Encounter (HOSPITAL_BASED_OUTPATIENT_CLINIC_OR_DEPARTMENT_OTHER): Payer: Self-pay

## 2021-11-05 DIAGNOSIS — G44209 Tension-type headache, unspecified, not intractable: Secondary | ICD-10-CM

## 2021-11-05 DIAGNOSIS — R030 Elevated blood-pressure reading, without diagnosis of hypertension: Secondary | ICD-10-CM

## 2021-11-05 MED ORDER — KETOROLAC TROMETHAMINE 60 MG/2ML IM SOLN
30.0000 mg | Freq: Once | INTRAMUSCULAR | Status: AC
  Administered 2021-11-05: 30 mg via INTRAMUSCULAR
  Filled 2021-11-05: qty 2

## 2021-11-05 MED ORDER — PROCHLORPERAZINE EDISYLATE 10 MG/2ML IJ SOLN
10.0000 mg | Freq: Once | INTRAMUSCULAR | Status: AC
  Administered 2021-11-05: 10 mg via INTRAMUSCULAR
  Filled 2021-11-05: qty 2

## 2021-11-05 NOTE — ED Notes (Signed)
Pt verbalizes understanding of discharge instructions. Opportunity for questioning and answers were provided. Pt discharged from ED to home.   ? ?

## 2021-11-05 NOTE — ED Provider Notes (Signed)
?[facility]PT ?Provider Note ? ?CSN: [id number] ?Arrival date & time: 11/04/21 2344 ? ?Chief Complaint(s) ?Hypertension ? ?HPI ?Rachel Soto is a 46 y.o. female   ? ?The history is provided by the patient.  ?Hypertension ?This is a recurrent problem. The current episode started yesterday. Progression since onset: waxing and waning. Associated symptoms include headaches. Exacerbated by: emotional stress. reports that her brother died recently. Nothing relieves the symptoms. She has tried nothing for the symptoms.  ?Headache ?Pain location:  Occipital ?Quality:  Dull ?Onset quality:  Gradual ?Timing:  Intermittent ?Progression:  Waxing and waning ?Chronicity:  Recurrent ?Context: emotional stress   ?Relieved by:  NSAIDs ? ? ? ?Past Medical History ?Past Medical History:  ?Diagnosis Date  ? Asthma   ? ?There are no problems to display for this patient. ? ?Home Medication(s) ?Prior to Admission medications   ?Medication Sig Start Date End Date Taking? Authorizing Provider  ?benzonatate (TESSALON) 100 MG capsule Take 1 capsule (100 mg total) by mouth every 8 (eight) hours. 08/23/21   Long, Arlyss Repress, MD  ?hydrochlorothiazide (HYDRODIURIL) 25 MG tablet Take 1 tablet (25 mg total) by mouth daily. 09/07/21   Gilda Crease, MD  ?loratadine (CLARITIN) 10 MG tablet Take 10 mg by mouth daily.    [provider]  ?ondansetron (ZOFRAN ODT) 4 MG disintegrating tablet Take 1 tablet (4 mg total) by mouth every 8 (eight) hours as needed for up to 10 doses for nausea or vomiting. 09/01/20   Sabino Donovan, MD  ?                                                                                                                                  ?Allergies ?Patient has no known allergies. ? ?Review of Systems ?Review of Systems  ?Neurological:  Positive for headaches.  ?As noted in HPI ? ?Physical Exam ?Vital Signs  ?I have reviewed the triage vital signs ?BP 125/67   Pulse 73   Temp 98.6 ?F (37 ?C)  (Oral)   Resp 18   Ht 5\' 3"  (1.6 m)   Wt (!) 139.7 kg   LMP 10/26/2021   SpO2 100%   BMI 54.56 kg/m?  ? ?Physical Exam ?Vitals reviewed.  ?Constitutional:   ?   General: She is not in acute distress. ?   Appearance: She is well-developed. She is not diaphoretic.  ?HENT:  ?   Head: Normocephalic and atraumatic.  ?   Right Ear: External ear normal.  ?   Left Ear: External ear normal.  ?   Nose: Nose normal.  ?Eyes:  ?   General: No scleral icterus. ?   Conjunctiva/sclera: Conjunctivae normal.  ?Neck:  ?   Trachea: Phonation normal.  ?Cardiovascular:  ?   Rate and Rhythm: Normal rate and regular rhythm.  ?Pulmonary:  ?   Effort: Pulmonary effort is normal. No respiratory distress.  ?   Breath sounds:  No stridor.  ?Abdominal:  ?   General: There is no distension.  ?Musculoskeletal:     ?   General: Normal range of motion.  ?   Cervical back: Normal range of motion.  ?Neurological:  ?   Mental Status: She is alert and oriented to person, place, and time.  ?   Sensory: Sensation is intact.  ?   Motor: Motor function is intact.  ?Psychiatric:     ?   Behavior: Behavior normal.  ? ? ?ED Results and Treatments ?Labs ?(all labs ordered are listed, but only abnormal results are displayed) ?Labs Reviewed - No data to display                                                                                                                       ?EKG ? EKG Interpretation ? ?Date/Time:    ?Ventricular Rate:    ?PR Interval:    ?QRS Duration:   ?QT Interval:    ?QTC Calculation:   ?R Axis:     ?Text Interpretation:   ?  ? ?  ? ?Radiology ?No results found. ? ?Pertinent labs & imaging results that were available during my care of the patient were reviewed by me and considered in my medical decision making (see MDM for details). ? ?Medications Ordered in ED ?Medications  ?ketorolac (TORADOL) injection 30 mg (has no administration in time range)  ?prochlorperazine (COMPAZINE) injection 10 mg (has no administration in time range)  ?                                                                ?                                                                    ?Procedures ?Procedures ? ?(including critical care time) ? ?Medical Decision Making / ED Course ? ? ? Complexity of Problem: ? ?Patient's presenting problem/concern, DDX, and MDM listed below: ?Elevated BP ?Related to emotional stress ?Improving w/o intervention ?Headache ?Non focal neuro exam. No recent head trauma. No fever. Doubt meningitis. Doubt intracranial bleed. Doubt IIH. No indication for imaging.  ?Will treat with migraine cocktail and reevaluate. ? ?    ?ED Course:   ? ?Assessment, Add'l Intervention, and Reassessment: ?Elevated BP ?Resolved without intervention ?Headache ?Tension like ?Improved with headache cocktail  ? ? ?Final Clinical Impression(s) / ED Diagnoses ?Final diagnoses:  ?Elevated blood pressure reading  ?Tension headache  ? ?The patient appears reasonably screened and/or stabilized for discharge and I  doubt any other medical condition or other [facility] requiring further screening, evaluation, or treatment in the ED at this time prior to discharge. Safe for discharge with strict return precautions. ? ?Disposition: Discharge ? ?Condition: Good ? ?I have discussed the results, Dx and Tx plan with the patient/family who expressed understanding and agree(s) with the plan. Discharge instructions discussed at length. The patient/family was given strict return precautions who verbalized understanding of the instructions. No further questions at time of discharge.  ? ? ?ED Discharge Orders   ? ? None  ? ?  ? ? ? ? ?Follow Up: ?Primary care provider ? ?Schedule an appointment as soon as possible for a visit  ?if you do not have a primary care physician, contact [facility] at [phone] for referral ? ? ? ?  ? ? ? ? ? ?This chart was dictated using voice recognition software.  Despite best efforts to proofread,  errors can occur which can change the documentation  meaning. ? ?  ?Nira Conn, MD ?11/05/21 0321 ? ?

## 2021-11-05 NOTE — ED Triage Notes (Signed)
Pt complaining of high blood pressure today, as high as 180/80, has had a recent death in family and has not been sleeping well ?

## 2022-02-02 ENCOUNTER — Encounter (HOSPITAL_BASED_OUTPATIENT_CLINIC_OR_DEPARTMENT_OTHER): Payer: Self-pay | Admitting: Emergency Medicine

## 2022-02-02 ENCOUNTER — Other Ambulatory Visit: Payer: Self-pay

## 2022-02-02 ENCOUNTER — Emergency Department (HOSPITAL_BASED_OUTPATIENT_CLINIC_OR_DEPARTMENT_OTHER)
Admission: EM | Admit: 2022-02-02 | Discharge: 2022-02-02 | Disposition: A | Discharge status: Home / Self Care | Payer: Medicaid Other | Attending: Emergency Medicine | Admitting: Emergency Medicine

## 2022-02-02 ENCOUNTER — Emergency Department (HOSPITAL_BASED_OUTPATIENT_CLINIC_OR_DEPARTMENT_OTHER): Payer: Medicaid Other | Admitting: Radiology

## 2022-02-02 DIAGNOSIS — J45909 Unspecified asthma, uncomplicated: Secondary | ICD-10-CM | POA: Insufficient documentation

## 2022-02-02 DIAGNOSIS — Z79899 Other long term (current) drug therapy: Secondary | ICD-10-CM | POA: Insufficient documentation

## 2022-02-02 DIAGNOSIS — Z20822 Contact with and (suspected) exposure to covid-19: Secondary | ICD-10-CM | POA: Insufficient documentation

## 2022-02-02 DIAGNOSIS — N9489 Other specified conditions associated with female genital organs and menstrual cycle: Secondary | ICD-10-CM | POA: Insufficient documentation

## 2022-02-02 DIAGNOSIS — J069 Acute upper respiratory infection, unspecified: Secondary | ICD-10-CM | POA: Insufficient documentation

## 2022-02-02 DIAGNOSIS — I10 Essential (primary) hypertension: Secondary | ICD-10-CM | POA: Insufficient documentation

## 2022-02-02 LAB — CBC
HCT: 33.6 % — ABNORMAL LOW (ref 36.0–46.0)
Hemoglobin: 11.3 g/dL — ABNORMAL LOW (ref 12.0–15.0)
MCH: 27.9 pg (ref 26.0–34.0)
MCHC: 33.6 g/dL (ref 30.0–36.0)
MCV: 83 fL (ref 80.0–100.0)
Platelets: 297 10*3/uL (ref 150–400)
RBC: 4.05 MIL/uL (ref 3.87–5.11)
RDW: 13.5 % (ref 11.5–15.5)
WBC: 9.2 10*3/uL (ref 4.0–10.5)
nRBC: 0 % (ref 0.0–0.2)

## 2022-02-02 LAB — BASIC METABOLIC PANEL
Anion gap: 11 (ref 5–15)
BUN: 11 mg/dL (ref 6–20)
CO2: 25 mmol/L (ref 22–32)
Calcium: 9 mg/dL (ref 8.9–10.3)
Chloride: 103 mmol/L (ref 98–111)
Creatinine, Ser: 0.49 mg/dL (ref 0.44–1.00)
GFR, Estimated: >60 mL/min (ref >60)
Glucose, Bld: 88 mg/dL (ref 70–99)
Potassium: 3.5 mmol/L (ref 3.5–5.1)
Sodium: 139 mmol/L (ref 135–145)

## 2022-02-02 LAB — SARS CORONAVIRUS 2 BY RT PCR: SARS Coronavirus 2 by RT PCR: NEGATIVE

## 2022-02-02 LAB — TROPONIN I (HIGH SENSITIVITY): Troponin I (High Sensitivity): 2 ng/L (ref <18)

## 2022-02-02 LAB — HCG, QUANTITATIVE, PREGNANCY: hCG, Beta Chain, Quant, S: <1 m[IU]/mL (ref <5)

## 2022-02-02 MED ORDER — BENZONATATE 100 MG PO CAPS: 100.0000 mg | ORAL_CAPSULE | Freq: Three times a day (TID) | ORAL | 0 refills | Status: AC | PRN

## 2022-02-02 MED ORDER — ALBUTEROL SULFATE HFA 108 (90 BASE) MCG/ACT IN AERS: 2.0000 | INHALATION_SPRAY | RESPIRATORY_TRACT | 3 refills | Status: AC | PRN

## 2022-02-02 NOTE — ED Provider Notes (Signed)
[facility] EMERGENCY DEPT Provider Note   CSN: [id number] Arrival date & time: 02/02/22  [id number]     History  No chief complaint on file.   Rachel Soto is a 46 y.o. female history of obesity, asthma, hypertension, presenting to the ED with complaint of shortness of breath.  She reports that her granddaughter had a viral type syndrome about a week ago, the patient has been having similar congestion, runny nose, head cold, and persistent cough, sore throat, for about a week.  She has tried over-the-counter medicines reports she is having difficulty sleeping due to coughing.  She says her albuterol inhaler is expired, she does not feel that she is wheezing.  She reports she has not been able to establish care with a PCP because she was not eligible for online enrollment in health insurance, missing the window for that.  She is interested in seeing a PCP for her other medical conditions.  HPI     Home Medications Prior to Admission medications   Medication Sig Start Date End Date Taking? Authorizing Provider  albuterol (VENTOLIN HFA) 108 (90 Base) MCG/ACT inhaler Inhale 2 puffs into the lungs every 4 (four) hours as needed for wheezing or shortness of breath. 02/02/22  Yes Carol Loftin, Carola Rhine, MD  benzonatate (TESSALON) 100 MG capsule Take 1 capsule (100 mg total) by mouth 3 (three) times daily as needed for up to 21 days for cough. 02/02/22 02/23/22 Yes Chasitee Zenker, Carola Rhine, MD  benzonatate (TESSALON) 100 MG capsule Take 1 capsule (100 mg total) by mouth every 8 (eight) hours. 08/23/21   Long, Wonda Olds, MD  hydrochlorothiazide (HYDRODIURIL) 25 MG tablet Take 1 tablet (25 mg total) by mouth daily. 09/07/21   Orpah Greek, MD  loratadine (CLARITIN) 10 MG tablet Take 10 mg by mouth daily.    [provider]  ondansetron (ZOFRAN ODT) 4 MG disintegrating tablet Take 1 tablet (4 mg total) by mouth every 8 (eight) hours as needed for up to 10 doses for nausea or vomiting.  09/01/20   Breck Coons, MD      Allergies    Patient has no known allergies.    Review of Systems   Review of Systems  Physical Exam Updated Vital Signs BP (!) 176/93   Pulse 85   Temp 98.9 F (37.2 C) (Oral)   Resp 18   LMP 01/27/2022   SpO2 99%  Physical Exam Constitutional:      General: She is not in acute distress.    Appearance: She is obese.  HENT:     Head: Normocephalic and atraumatic.  Eyes:     Conjunctiva/sclera: Conjunctivae normal.     Pupils: Pupils are equal, round, and reactive to light.  Cardiovascular:     Rate and Rhythm: Normal rate and regular rhythm.  Pulmonary:     Effort: Pulmonary effort is normal. No respiratory distress.     Breath sounds: Normal breath sounds. No wheezing.  Abdominal:     General: There is no distension.     Tenderness: There is no abdominal tenderness.  Skin:    General: Skin is warm and dry.  Neurological:     General: No focal deficit present.     Mental Status: She is alert. Mental status is at baseline.  Psychiatric:        Mood and Affect: Mood normal.        Behavior: Behavior normal.     ED Results / Procedures / Treatments  Labs (all labs ordered are listed, but only abnormal results are displayed) Labs Reviewed  CBC - Abnormal; Notable for the following components:      Result Value   Hemoglobin 11.3 (*)    HCT 33.6 (*)    All other components within normal limits  SARS CORONAVIRUS 2 BY RT PCR  BASIC METABOLIC PANEL  HCG, QUANTITATIVE, PREGNANCY  TROPONIN I (HIGH SENSITIVITY)    EKG EKG Interpretation  Date/Time:  Friday February 02 2022 08:17:34 EDT Ventricular Rate:  81 PR Interval:  163 QRS Duration: 99 QT Interval:  368 QTC Calculation: 428 R Axis:   76 Text Interpretation: Sinus rhythm Baseline wander in lead(s) V4 Confirmed by Alvester Chou [phone]) on 02/02/2022 8:22:17 AM  Radiology DG Chest 2 View  Result Date: 02/02/2022 CLINICAL DATA:  Cough, wheezing and shortness of breath.  Some fevers at home. EXAM: CHEST - 2 VIEW COMPARISON:  09/07/2021 and older exams. FINDINGS: The heart size and mediastinal contours are within normal limits. Both lungs are clear. No pleural effusion or pneumothorax. The visualized skeletal structures are unremarkable. IMPRESSION: Normal chest radiographs. Electronically Signed   By: Amie Portland M.D.   On: 02/02/2022 08:50    Procedures Procedures    Medications Ordered in ED Medications - No data to display  ED Course/ Medical Decision Making/ A&P                           Medical Decision Making Amount and/or Complexity of Data Reviewed Labs: ordered. Radiology: ordered.  Risk Prescription drug management.   This patient presents to the ED with concern for cough, congestion, fever, sore throat. This involves an extensive number of treatment options, and is a complaint that carries with it a high risk of complications and morbidity.  The differential diagnosis includes viral syndrome most likely versus pneumonia versus other type of infection  Co-morbidities that complicate the patient evaluation: History of asthma at high risk for pulmonary complications,   I ordered and personally interpreted labs.  The pertinent results include: No emergent findings noted.  Patient has some mild chronic anemia, unchanged from prior records  I ordered imaging studies including x-ray of the chest I independently visualized and interpreted imaging which showed no focal infiltrate or pneumonia I agree with the radiologist interpretation  The patient was maintained on a cardiac monitor.  I personally viewed and interpreted the cardiac monitored which showed an underlying rhythm of: Normal sinus rhythm  Per my interpretation the patient's ECG shows normal sinus rhythm  Test Considered: I have low clinical suspicion for PE based on his presentation.  Do not feel she needs a CT angiogram.  Likewise she is not wheezing at all on exam, have low  suspicion for asthma exacerbation.  However I can provide a refill for her albuterol while she is in the ED for future use.  After the interventions noted above, I reevaluated the patient and found that they have: stayed the same  Social Determinants of Health:I will provide information for the [facility] due to her lack of insurance, she clearly does need a PCP.  I emphasized the importance of needing outpatient follow-up with her primary care provider for her other medical conditions, to avoid frequent returning visits to the ER.  She understands that visits to the ER do not replace the need for outpatient PCP management, and that we do not do routine outpatient workups in the ED.  I suspect is a viral URI syndrome.  She can be discharged with Jerilynn Som, continued over-the-counter medications for suspected viral syndrome.  She verbalized understanding and agreement        Final Clinical Impression(s) / ED Diagnoses Final diagnoses:  Viral URI with cough    Rx / DC Orders ED Discharge Orders          Ordered    benzonatate (TESSALON) 100 MG capsule  3 times daily PRN        02/02/22 0933    albuterol (VENTOLIN HFA) 108 (90 Base) MCG/ACT inhaler  Every 4 hours PRN        02/02/22 0933              Terald Sleeper, MD 02/02/22 [phone]

## 2022-02-02 NOTE — Discharge Instructions (Addendum)
Your workup today did not show signs of pneumonia, bacterial infection, and your COVID test was negative.  You likely have a viral respiratory infection.  Please call to make a new patient appointment at [facility], where you may be able to be seen by a new provider as a PCP.  It is important that you follow-up as an outpatient for your other medical conditions, including your high blood pressure, asthma, and other conditions that are not genuinely worked up in the ER.

## 2022-02-02 NOTE — ED Triage Notes (Signed)
Pt here from home with c/o productive cough with green sputum fevers at home , also c/o chest pain  and back pain

## 2022-06-23 ENCOUNTER — Emergency Department (HOSPITAL_BASED_OUTPATIENT_CLINIC_OR_DEPARTMENT_OTHER)
Admission: EM | Admit: 2022-06-23 | Discharge: 2022-06-23 | Disposition: A | Discharge status: Home / Self Care | Payer: Self-pay | Attending: Emergency Medicine | Admitting: Emergency Medicine

## 2022-06-23 ENCOUNTER — Other Ambulatory Visit: Payer: Self-pay

## 2022-06-23 ENCOUNTER — Emergency Department (HOSPITAL_BASED_OUTPATIENT_CLINIC_OR_DEPARTMENT_OTHER): Payer: Self-pay

## 2022-06-23 ENCOUNTER — Encounter (HOSPITAL_BASED_OUTPATIENT_CLINIC_OR_DEPARTMENT_OTHER): Payer: Self-pay | Admitting: Emergency Medicine

## 2022-06-23 DIAGNOSIS — Z79899 Other long term (current) drug therapy: Secondary | ICD-10-CM | POA: Insufficient documentation

## 2022-06-23 DIAGNOSIS — J45909 Unspecified asthma, uncomplicated: Secondary | ICD-10-CM | POA: Insufficient documentation

## 2022-06-23 DIAGNOSIS — I10 Essential (primary) hypertension: Secondary | ICD-10-CM | POA: Insufficient documentation

## 2022-06-23 LAB — BASIC METABOLIC PANEL
Anion gap: 8 (ref 5–15)
BUN: 10 mg/dL (ref 6–20)
CO2: 27 mmol/L (ref 22–32)
Calcium: 10 mg/dL (ref 8.9–10.3)
Chloride: 102 mmol/L (ref 98–111)
Creatinine, Ser: 0.48 mg/dL (ref 0.44–1.00)
GFR, Estimated: >60 mL/min (ref >60)
Glucose, Bld: 89 mg/dL (ref 70–99)
Potassium: 4.8 mmol/L (ref 3.5–5.1)
Sodium: 137 mmol/L (ref 135–145)

## 2022-06-23 LAB — TROPONIN I (HIGH SENSITIVITY): Troponin I (High Sensitivity): 2 ng/L (ref <18)

## 2022-06-23 LAB — CBC WITH DIFFERENTIAL/PLATELET
Abs Immature Granulocytes: 0.02 10*3/uL (ref 0.00–0.07)
Basophils Absolute: 0 10*3/uL (ref 0.0–0.1)
Basophils Relative: 0 %
Eosinophils Absolute: 0.1 10*3/uL (ref 0.0–0.5)
Eosinophils Relative: 1 %
HCT: 35.4 % — ABNORMAL LOW (ref 36.0–46.0)
Hemoglobin: 11.8 g/dL — ABNORMAL LOW (ref 12.0–15.0)
Immature Granulocytes: 0 %
Lymphocytes Relative: 28 %
Lymphs Abs: 2.8 10*3/uL (ref 0.7–4.0)
MCH: 28 pg (ref 26.0–34.0)
MCHC: 33.3 g/dL (ref 30.0–36.0)
MCV: 84.1 fL (ref 80.0–100.0)
Monocytes Absolute: 0.6 10*3/uL (ref 0.1–1.0)
Monocytes Relative: 6 %
Neutro Abs: 6.3 10*3/uL (ref 1.7–7.7)
Neutrophils Relative %: 65 %
Platelets: 323 10*3/uL (ref 150–400)
RBC: 4.21 MIL/uL (ref 3.87–5.11)
RDW: 14.1 % (ref 11.5–15.5)
WBC: 9.8 10*3/uL (ref 4.0–10.5)
nRBC: 0 % (ref 0.0–0.2)

## 2022-06-23 MED ORDER — ACETAMINOPHEN 500 MG PO TABS
1000.0000 mg | ORAL_TABLET | Freq: Once | ORAL | Status: AC
  Administered 2022-06-23: 1000 mg via ORAL
  Filled 2022-06-23: qty 2

## 2022-06-23 MED ORDER — HYDROCHLOROTHIAZIDE 25 MG PO TABS: 25.0000 mg | ORAL_TABLET | Freq: Every day | ORAL | 3 refills | Status: DC

## 2022-06-23 NOTE — ED Triage Notes (Addendum)
Pt c/o hypertension x 1 week. Pt states BP has been in the 200s systolic. Pt also c/o intermittent chest pain and headache

## 2022-06-23 NOTE — Discharge Instructions (Signed)
You were seen today for high blood pressure.  Your workup today is reassuring.  You will be started on a blood pressure medication.  It is important that you establish primary care follow-up once your insurance is in place in January.  In the meantime, you were given [place] and wellness follow-up.

## 2022-06-23 NOTE — ED Provider Notes (Signed)
[facility] Provider Note   CSN: [id number] Arrival date & time: 06/23/22  1696     History  Chief Complaint  Patient presents with   Hypertension    Rachel Soto is a 46 y.o. female.  HPI     This is a 46 year old female who presents with concerns for high blood pressure.  Patient reports she has been taking her blood pressure at home because of some ongoing headaches.  She states that she has also recently developed some left-sided chest discomfort intermittently.  It is not exertional in nature.  She feels like it is related to high blood pressure.  She states that her blood pressure at home are consistently 200s over 100s.  She has not been able to establish primary care or get ongoing blood pressure medications secondary to insurance issues but does have insurance starting January 1.  She previously was on HCTZ.  She is not currently having any chest pain but does report headache.  Denies weakness, numbness, strokelike symptoms.  Home Medications Prior to Admission medications   Medication Sig Start Date End Date Taking? Authorizing Provider  albuterol (VENTOLIN HFA) 108 (90 Base) MCG/ACT inhaler Inhale 2 puffs into the lungs every 4 (four) hours as needed for wheezing or shortness of breath. 02/02/22   Terald Sleeper, MD  benzonatate (TESSALON) 100 MG capsule Take 1 capsule (100 mg total) by mouth every 8 (eight) hours. 08/23/21   Long, Arlyss Repress, MD  hydrochlorothiazide (HYDRODIURIL) 25 MG tablet Take 1 tablet (25 mg total) by mouth daily. 06/23/22   Sun Kihn, Mayer Masker, MD  loratadine (CLARITIN) 10 MG tablet Take 10 mg by mouth daily.    [provider]  ondansetron (ZOFRAN ODT) 4 MG disintegrating tablet Take 1 tablet (4 mg total) by mouth every 8 (eight) hours as needed for up to 10 doses for nausea or vomiting. 09/01/20   Sabino Donovan, MD      Allergies    Patient has no known allergies.    Review of Systems   Review of Systems   Constitutional:  Negative for fever.  Respiratory:  Negative for shortness of breath.   Cardiovascular:  Positive for chest pain. Negative for leg swelling.  Gastrointestinal:  Negative for abdominal pain, nausea and vomiting.  All other systems reviewed and are negative.   Physical Exam Updated Vital Signs BP (!) 154/84   Pulse 66   Temp 98 F (36.7 C)   Resp (!) 26   Ht 1.6 m (5\' 3" )   Wt (!) 142.4 kg   LMP 06/21/2022   SpO2 100%   BMI 55.62 kg/m  Physical Exam Vitals and nursing note reviewed.  Constitutional:      Appearance: She is well-developed. She is obese. She is not ill-appearing.  HENT:     Head: Normocephalic and atraumatic.  Eyes:     Pupils: Pupils are equal, round, and reactive to light.  Cardiovascular:     Rate and Rhythm: Normal rate and regular rhythm.     Heart sounds: Normal heart sounds.  Pulmonary:     Effort: Pulmonary effort is normal. No respiratory distress.     Breath sounds: No wheezing.  Abdominal:     Palpations: Abdomen is soft.     Tenderness: There is no abdominal tenderness.  Musculoskeletal:     Cervical back: Neck supple.  Skin:    General: Skin is warm and dry.  Neurological:     Mental Status: She is  alert and oriented to person, place, and time.     Comments: Cranial nerves II through XII intact, 5 out of 5 strength in all 4 extremities, no dysmetria to finger-nose-finger  Psychiatric:        Mood and Affect: Mood normal.     ED Results / Procedures / Treatments   Labs (all labs ordered are listed, but only abnormal results are displayed) Labs Reviewed  CBC WITH DIFFERENTIAL/PLATELET - Abnormal; Notable for the following components:      Result Value   Hemoglobin 11.8 (*)    HCT 35.4 (*)    All other components within normal limits  BASIC METABOLIC PANEL  TROPONIN I (HIGH SENSITIVITY)    EKG EKG Interpretation  Date/Time:  Saturday June 23 2022 05:25:50 EST Ventricular Rate:  77 PR Interval:  166 QRS  Duration: 96 QT Interval:  390 QTC Calculation: 442 R Axis:   73 Text Interpretation: Sinus rhythm Confirmed by Ross Marcus ([id number]) on 06/23/2022 6:19:23 AM  Radiology DG Chest Portable 1 View  Result Date: 06/23/2022 CLINICAL DATA:  Chest pains and hypertension. EXAM: PORTABLE CHEST 1 VIEW COMPARISON:  PA Lat 02/02/2022 FINDINGS: The heart size and mediastinal contours are within normal limits. Both lungs are clear. The visualized skeletal structures are unremarkable. There is overlying monitor wiring. IMPRESSION: No evidence of acute chest disease.  Unchanged. Electronically Signed   By: Almira Bar M.D.   On: 06/23/2022 06:21    Procedures Procedures    Medications Ordered in ED Medications  acetaminophen (TYLENOL) tablet 1,000 mg (1,000 mg Oral Given 06/23/22 0550)    ED Course/ Medical Decision Making/ A&P                           Medical Decision Making Amount and/or Complexity of Data Reviewed Labs: ordered. Radiology: ordered.  Risk OTC drugs. Prescription drug management.   This patient presents to the ED for concern of hypertension, this involves an extensive number of treatment options, and is a complaint that carries with it a high risk of complications and morbidity.  I considered the following differential and admission for this acute, potentially life threatening condition.  The differential diagnosis includes pretense of urgency, hypertensive emergency, essential hypertension, ACS  MDM:    This is a 46 year old female who presents with concerns for high blood pressure.  She is nontoxic and vital signs today are more reassuring.  Blood pressure ranging from 125/68-152/92.  Patient continues to endorse a dull headache.  EKG shows no evidence of acute ischemia or arrhythmia.  Troponin x 1 negative.  Basic lab work is largely reassuring.  Patient was previously on a thiazide diuretic.  She has no signs or symptoms right now of hypertensive urgency or  emergency.  Given her risk factors and prior history of hypertension, feel it is reasonable to reinitiate blood pressure medication.  She was given a prescription with 2 refills.  She was referred to [facility].  (Labs, imaging, consults)  Labs: I Ordered, and personally interpreted labs.  The pertinent results include: CBC, BMP, troponin  Imaging Studies ordered: I ordered imaging studies including chest x-ray I independently visualized and interpreted imaging. I agree with the radiologist interpretation  Additional history obtained from chart review.  External records from outside source obtained and reviewed including prior evaluations  Cardiac Monitoring: The patient was maintained on a cardiac monitor.  I personally viewed and interpreted the cardiac monitored which showed  an underlying rhythm of: Sinus rhythm  Reevaluation: After the interventions noted above, I reevaluated the patient and found that they have :stayed the same  Social Determinants of Health:  lives independently  Disposition: Discharge  Co morbidities that complicate the patient evaluation  Past Medical History:  Diagnosis Date   Asthma      Medicines Meds ordered this encounter  Medications   acetaminophen (TYLENOL) tablet 1,000 mg   hydrochlorothiazide (HYDRODIURIL) 25 MG tablet    Sig: Take 1 tablet (25 mg total) by mouth daily.    Dispense:  30 tablet    Refill:  3    I have reviewed the patients home medicines and have made adjustments as needed  Problem List / ED Course: Problem List Items Addressed This Visit   None Visit Diagnoses     Uncontrolled hypertension    -  Primary   Relevant Medications   hydrochlorothiazide (HYDRODIURIL) 25 MG tablet                   Final Clinical Impression(s) / ED Diagnoses Final diagnoses:  Uncontrolled hypertension    Rx / DC Orders ED Discharge Orders          Ordered    hydrochlorothiazide (HYDRODIURIL) 25 MG  tablet  Daily        06/23/22 0633              Shon Baton, MD 06/23/22 [phone]

## 2022-07-12 ENCOUNTER — Other Ambulatory Visit: Payer: Self-pay

## 2022-07-12 ENCOUNTER — Encounter (HOSPITAL_BASED_OUTPATIENT_CLINIC_OR_DEPARTMENT_OTHER): Payer: Self-pay | Admitting: Emergency Medicine

## 2022-07-12 ENCOUNTER — Emergency Department (HOSPITAL_BASED_OUTPATIENT_CLINIC_OR_DEPARTMENT_OTHER)
Admission: EM | Admit: 2022-07-12 | Discharge: 2022-07-12 | Disposition: A | Discharge status: Home / Self Care | Payer: 59 | Attending: Emergency Medicine | Admitting: Emergency Medicine

## 2022-07-12 DIAGNOSIS — R42 Dizziness and giddiness: Secondary | ICD-10-CM | POA: Insufficient documentation

## 2022-07-12 DIAGNOSIS — J45909 Unspecified asthma, uncomplicated: Secondary | ICD-10-CM | POA: Diagnosis not present

## 2022-07-12 LAB — COMPREHENSIVE METABOLIC PANEL
ALT: 16 U/L (ref 0–44)
AST: 15 U/L (ref 15–41)
Albumin: 3.9 g/dL (ref 3.5–5.0)
Alkaline Phosphatase: 80 U/L (ref 38–126)
Anion gap: 9 (ref 5–15)
BUN: 10 mg/dL (ref 6–20)
CO2: 25 mmol/L (ref 22–32)
Calcium: 8.9 mg/dL (ref 8.9–10.3)
Chloride: 104 mmol/L (ref 98–111)
Creatinine, Ser: 0.47 mg/dL (ref 0.44–1.00)
GFR, Estimated: >60 mL/min (ref >60)
Glucose, Bld: 105 mg/dL — ABNORMAL HIGH (ref 70–99)
Potassium: 3.9 mmol/L (ref 3.5–5.1)
Sodium: 138 mmol/L (ref 135–145)
Total Bilirubin: 0.3 mg/dL (ref 0.3–1.2)
Total Protein: 7.1 g/dL (ref 6.5–8.1)

## 2022-07-12 LAB — CBC
HCT: 36.3 % (ref 36.0–46.0)
Hemoglobin: 12.1 g/dL (ref 12.0–15.0)
MCH: 28.2 pg (ref 26.0–34.0)
MCHC: 33.3 g/dL (ref 30.0–36.0)
MCV: 84.6 fL (ref 80.0–100.0)
Platelets: 300 10*3/uL (ref 150–400)
RBC: 4.29 MIL/uL (ref 3.87–5.11)
RDW: 14.2 % (ref 11.5–15.5)
WBC: 9 10*3/uL (ref 4.0–10.5)
nRBC: 0 % (ref 0.0–0.2)

## 2022-07-12 MED ORDER — SODIUM CHLORIDE 0.9 % IV BOLUS
1000.0000 mL | Freq: Once | INTRAVENOUS | Status: AC
  Administered 2022-07-12: 1000 mL via INTRAVENOUS

## 2022-07-12 MED ORDER — DIPHENHYDRAMINE HCL 50 MG/ML IJ SOLN
25.0000 mg | Freq: Once | INTRAMUSCULAR | Status: AC
  Administered 2022-07-12: 25 mg via INTRAVENOUS
  Filled 2022-07-12: qty 1

## 2022-07-12 MED ORDER — ONDANSETRON HCL 4 MG/2ML IJ SOLN
4.0000 mg | Freq: Once | INTRAMUSCULAR | Status: AC
  Administered 2022-07-12: 4 mg via INTRAVENOUS
  Filled 2022-07-12: qty 2

## 2022-07-12 MED ORDER — PROCHLORPERAZINE EDISYLATE 10 MG/2ML IJ SOLN
10.0000 mg | Freq: Once | INTRAMUSCULAR | Status: AC
  Administered 2022-07-12: 10 mg via INTRAVENOUS
  Filled 2022-07-12: qty 2

## 2022-07-12 MED ORDER — MECLIZINE HCL 25 MG PO TABS: 25.0000 mg | ORAL_TABLET | Freq: Three times a day (TID) | ORAL | 0 refills | Status: DC | PRN

## 2022-07-12 MED ORDER — KETOROLAC TROMETHAMINE 15 MG/ML IJ SOLN
15.0000 mg | Freq: Once | INTRAMUSCULAR | Status: AC
  Administered 2022-07-12: 15 mg via INTRAVENOUS
  Filled 2022-07-12: qty 1

## 2022-07-12 MED ORDER — MECLIZINE HCL 25 MG PO TABS
50.0000 mg | ORAL_TABLET | Freq: Once | ORAL | Status: AC
  Administered 2022-07-12: 50 mg via ORAL
  Filled 2022-07-12: qty 2

## 2022-07-12 NOTE — Discharge Instructions (Signed)
You were evaluated in the Emergency Department and after careful evaluation, we did not find any emergent condition requiring admission or further testing in the hospital.  Your exam/testing today is overall reassuring.  Symptoms likely due to benign positional vertigo.  Use the meclizine medication as needed for vertigo.  You can also try the Epley maneuver at home.  Please return to the Emergency Department if you experience any worsening of your condition.   Thank you for allowing [place] to be a part of your care.

## 2022-07-12 NOTE — ED Provider Notes (Signed)
[facility] Emergency Department Provider Note MRN:  [id number]  Arrival date & time: 07/12/22     Chief Complaint   Vertigo History of Present Illness   Rachel Soto is a 47 y.o. year-old female with a history of asthma presenting to the ED with chief complaint of vertigo.  Patient woke up from a nap and stood up and suddenly realized that she felt really dizzy.  Room spinning.  Nauseated, almost passed out.  Symptoms much worse when she moves her head.  No numbness or weakness to the arms or legs.  Review of Systems  A thorough review of systems was obtained and all systems are negative except as noted in the HPI and PMH.   Patient's Health History    Past Medical History:  Diagnosis Date   Asthma     Past Surgical History:  Procedure Laterality Date   CESAREAN SECTION      Family History  Problem Relation Age of Onset   Diabetes Mother    Fibromyalgia Mother    Hepatitis C Mother     Social History   Socioeconomic History   Marital status: Single    Spouse name: Not on file   Number of children: Not on file   Years of education: Not on file   Highest education level: Not on file  Occupational History   Not on file  Tobacco Use   Smoking status: Never   Smokeless tobacco: Never  Vaping Use   Vaping Use: Never used  Substance and Sexual Activity   Alcohol use: No   Drug use: No   Sexual activity: Not on file  Other Topics Concern   Not on file  Social History Narrative   Not on file   Social Determinants of Health   Financial Resource Strain: Not on file  Food Insecurity: Not on file  Transportation Needs: Not on file  Physical Activity: Not on file  Stress: Not on file  Social Connections: Not on file  Intimate Partner Violence: Not on file     Physical Exam   Vitals:   07/12/22 0430 07/12/22 0445  BP: 128/60 (!) 121/55  Pulse: (!) 54 (!) 58  Resp: 19 17  Temp:    SpO2: 96% 97%    CONSTITUTIONAL:  Well-appearing, NAD NEURO/PSYCH:  Alert and oriented x 3, normal and symmetric strength and sensation, normal coordination, normal speech, no nystagmus EYES:  eyes equal and reactive ENT/NECK:  no LAD, no JVD CARDIO: Regular rate, well-perfused, normal S1 and S2 PULM:  CTAB no wheezing or rhonchi GI/GU:  non-distended, non-tender MSK/SPINE:  No gross deformities, no edema SKIN:  no rash, atraumatic   *Additional and/or pertinent findings included in MDM below  Diagnostic and Interventional Summary    EKG Interpretation  Date/Time:  Thursday July 12 2022 02:42:29 EST Ventricular Rate:  56 PR Interval:  188 QRS Duration: 103 QT Interval:  458 QTC Calculation: 442 R Axis:   73 Text Interpretation: Sinus rhythm Confirmed by Gerlene Fee [phone]) on 07/12/2022 3:44:34 AM       Labs Reviewed  COMPREHENSIVE METABOLIC PANEL - Abnormal; Notable for the following components:      Result Value   Glucose, Bld 105 (*)    All other components within normal limits  CBC    No orders to display    Medications  sodium chloride 0.9 % bolus 1,000 mL (0 mLs Intravenous Stopped 07/12/22 0305)  ondansetron (ZOFRAN) injection 4 mg (4 mg Intravenous  Given 07/12/22 0205)  meclizine (ANTIVERT) tablet 50 mg (50 mg Oral Given 07/12/22 0205)  diphenhydrAMINE (BENADRYL) injection 25 mg (25 mg Intravenous Given 07/12/22 0306)  prochlorperazine (COMPAZINE) injection 10 mg (10 mg Intravenous Given 07/12/22 0306)  ketorolac (TORADOL) 15 MG/ML injection 15 mg (15 mg Intravenous Given 07/12/22 0306)     Procedures  /  Critical Care Procedures  ED Course and Medical Decision Making  Initial Impression and Ddx Patient is experiencing vertigo, favored peripheral given the worsening with head position and patient's lack of cardiovascular risk factors.  Central vertigo would be very unlikely and patient has no other neurological deficits on exam.  She is attempting to lay very still in bed to stave off the  symptoms.  Attempting symptomatic control and will reassess.  Past medical/surgical history that increases complexity of ED encounter: None  Interpretation of Diagnostics I personally reviewed the EKG and my interpretation is as follows: Sinus rhythm  Labs reassuring with no significant blood count or electrolyte disturbance  Patient Reassessment and Ultimate Disposition/Management Clinical Course as of 07/12/22 0540  Thu Jul 12, 2022  0243 Nurse documentation of NIH of 1 is false.  NIH is zero [MB]    Clinical Course User Index [MB] Maudie Flakes, MD     On reassessment after medications patient is no longer experiencing vertigo.  She is ambulating without issue.  No indication for CNS imaging, no indication for further testing or admission.  Appropriate for discharge.  Patient management required discussion with the following services or consulting groups:  None  Complexity of Problems Addressed Acute illness or injury that poses threat of life of bodily function  Additional Data Reviewed and Analyzed Further history obtained from: Further history from spouse/family member  Additional Factors Impacting ED Encounter Risk Prescriptions  Barth Kirks. Sedonia Small, MD [place] [email or web address]  Final Clinical Impressions(s) / ED Diagnoses     ICD-10-CM   1. Vertigo  R42       ED Discharge Orders          Ordered    meclizine (ANTIVERT) 25 MG tablet  3 times daily PRN        07/12/22 0539             Discharge Instructions Discussed with and Provided to Patient:     Discharge Instructions      You were evaluated in the Emergency Department and after careful evaluation, we did not find any emergent condition requiring admission or further testing in the hospital.  Your exam/testing today is overall reassuring.  Symptoms likely due to benign positional vertigo.  Use the meclizine medication as needed for vertigo.   You can also try the Epley maneuver at home.  Please return to the Emergency Department if you experience any worsening of your condition.   Thank you for allowing [place] to be a part of your care.       Maudie Flakes, MD 07/12/22 [phone]

## 2022-07-12 NOTE — ED Notes (Signed)
Walked with patient to in front of nurse's desk and back. Pt stated she's very sleepy, but not dizzy as before coming to ED.

## 2022-07-12 NOTE — ED Triage Notes (Addendum)
  Patient comes in for dizziness and near syncopal episode that occurred around an hour ago.  Patient states she laid down on the couch to take a nap and woke up feeling like the room was spinning.  Hx of hypertension but normotensive in triage.  Endorses nausea with no vomiting, and altered gait.  Pain 6/10.  NIH - 1

## 2022-09-06 ENCOUNTER — Other Ambulatory Visit: Payer: Self-pay

## 2022-09-06 ENCOUNTER — Emergency Department (HOSPITAL_BASED_OUTPATIENT_CLINIC_OR_DEPARTMENT_OTHER)
Admission: EM | Admit: 2022-09-06 | Discharge: 2022-09-06 | Disposition: A | Discharge status: Home / Self Care | Payer: 59 | Attending: Emergency Medicine | Admitting: Emergency Medicine

## 2022-09-06 ENCOUNTER — Encounter (HOSPITAL_BASED_OUTPATIENT_CLINIC_OR_DEPARTMENT_OTHER): Payer: Self-pay

## 2022-09-06 DIAGNOSIS — J45909 Unspecified asthma, uncomplicated: Secondary | ICD-10-CM | POA: Insufficient documentation

## 2022-09-06 DIAGNOSIS — M545 Low back pain, unspecified: Secondary | ICD-10-CM | POA: Diagnosis present

## 2022-09-06 LAB — URINALYSIS, ROUTINE W REFLEX MICROSCOPIC
Bacteria, UA: NONE SEEN
Bilirubin Urine: NEGATIVE
Glucose, UA: NEGATIVE mg/dL
Hgb urine dipstick: NEGATIVE
Ketones, ur: NEGATIVE mg/dL
Nitrite: NEGATIVE
Specific Gravity, Urine: 1.032 — ABNORMAL HIGH (ref 1.005–1.030)
pH: 6.5 (ref 5.0–8.0)

## 2022-09-06 LAB — PREGNANCY, URINE: Preg Test, Ur: NEGATIVE

## 2022-09-06 MED ORDER — HYDROCODONE-ACETAMINOPHEN 5-325 MG PO TABS: 2.0000 | ORAL_TABLET | Freq: Four times a day (QID) | ORAL | 0 refills | Status: DC | PRN

## 2022-09-06 NOTE — ED Triage Notes (Signed)
Patient here POV from Home.  Endorses Back Pain for 2 Weeks. Last week went to [facility] and was given Muscle Relaxer's which have been ineffective. Mostly right Sided   No Dysuria. No Hematuria. No fever.  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  NAD Noted during triage. A&Ox4. GCS 15. Ambulatory.

## 2022-09-06 NOTE — ED Provider Notes (Signed)
[facility] Provider Note: Rachel Spurling, MD, FACEP  CSN: [id number] MRN: [id number] ARRIVAL: 09/06/22 at [place]: Rachel Soto  09/06/22 11:07 PM Rachel Soto is a 47 y.o. female with about 2 weeks of back pain.  The back pain is located along her right lumbar region up and down but primarily in the lower lumbar.  The pain is moderate to severe.  It is worse with movement or heavy lifting.  She is having no urinary symptoms and no change in bowel or bladder function.  She is having no numbness or weakness.  She was seen in urgent care last week and was told to take ibuprofen 600 mg every 6 hours and was prescribed Flexeril for use during the day and baclofen at night.  Despite these she has not gotten adequate relief.  She is currently in the process of moving and this is exacerbating her pain.   Past Medical History:  Diagnosis Date   Asthma     Past Surgical History:  Procedure Laterality Date   CESAREAN SECTION      Family History  Problem Relation Age of Onset   Diabetes Mother    Fibromyalgia Mother    Hepatitis C Mother     Social History   Tobacco Use   Smoking status: Never   Smokeless tobacco: Never  Vaping Use   Vaping Use: Never used  Substance Use Topics   Alcohol use: No   Drug use: No    Prior to Admission medications   Medication Sig Start Date End Date Taking? Authorizing Provider  HYDROcodone-acetaminophen (NORCO) 5-325 MG tablet Take 2 tablets by mouth every 6 (six) hours as needed for severe pain. 09/06/22  Yes Velta Rockholt, MD  albuterol (VENTOLIN HFA) 108 (90 Base) MCG/ACT inhaler Inhale 2 puffs into the lungs every 4 (four) hours as needed for wheezing or shortness of breath. 02/02/22   Wyvonnia Dusky, MD  benzonatate (TESSALON) 100 MG capsule Take 1 capsule (100 mg total) by mouth every 8 (eight) hours. 08/23/21   Long, Wonda Olds, MD  hydrochlorothiazide (HYDRODIURIL) 25 MG  tablet Take 1 tablet (25 mg total) by mouth daily. 06/23/22   Horton, Barbette Hair, MD  loratadine (CLARITIN) 10 MG tablet Take 10 mg by mouth daily.    [provider]  meclizine (ANTIVERT) 25 MG tablet Take 1 tablet (25 mg total) by mouth 3 (three) times daily as needed for dizziness. 07/12/22   Maudie Flakes, MD  ondansetron (ZOFRAN ODT) 4 MG disintegrating tablet Take 1 tablet (4 mg total) by mouth every 8 (eight) hours as needed for up to 10 doses for nausea or vomiting. 09/01/20   Breck Coons, MD    Allergies Patient has no known allergies.   REVIEW OF SYSTEMS  Negative except as noted here or in the History of Present Illness.   PHYSICAL EXAMINATION  Initial Vital Signs Blood pressure 138/81, pulse 83, temperature 97.8 F (36.6 C), resp. rate 18, height '5\' 3"'$  (1.6 m), weight (!) 144.2 kg, SpO2 99 %.  Examination General: Well-developed, well-nourished female in no acute distress; appearance consistent with age of record HENT: normocephalic; atraumatic Eyes: Normal appearance Neck: supple Heart: regular rate and rhythm Lungs: clear to auscultation bilaterally Abdomen: soft; nondistended; nontender; bowel sounds present Back: Right paralumbar tenderness; pain on movement of lower back Extremities: No deformity; full range of motion; pulses normal Neurologic: Awake, alert and  oriented; motor function intact in all extremities and symmetric; no facial droop Skin: Warm and dry Psychiatric: Normal mood and affect   RESULTS  Summary of this visit's results, reviewed and interpreted by myself:   EKG Interpretation  Date/Time:    Ventricular Rate:    PR Interval:    QRS Duration:   QT Interval:    QTC Calculation:   R Axis:     Text Interpretation:         Laboratory Studies: No results found for this or any previous visit (from the past 24 hour(s)). Imaging Studies: No results found.  ED COURSE and MDM  Nursing notes, initial and subsequent vitals  signs, including pulse oximetry, reviewed and interpreted by myself.  Vitals:   09/06/22 1955 09/06/22 1955 09/06/22 2232  BP:  (!) 147/98 138/81  Pulse:  94 83  Resp:  20 18  Temp:  97.8 F (36.6 C)   SpO2:  99% 99%  Weight: (!) 144.2 kg    Height: '5\' 3"'$  (1.6 m)     Medications - No data to display  We will add a short course of narcotic pain medicine to take after work and at night but she was advised not to take it while working.  Will refer to sports medicine for further evaluation and possible physical therapy.  PROCEDURES  Procedures   ED DIAGNOSES     ICD-10-CM   1. Acute right-sided low back pain without sciatica  M54.50          Ladaysha Soutar, Jenny Reichmann, MD 09/06/22 2318

## 2022-10-05 ENCOUNTER — Other Ambulatory Visit: Payer: Self-pay

## 2022-10-05 ENCOUNTER — Emergency Department (HOSPITAL_BASED_OUTPATIENT_CLINIC_OR_DEPARTMENT_OTHER)
Admission: EM | Admit: 2022-10-05 | Discharge: 2022-10-05 | Disposition: A | Discharge status: Home / Self Care | Payer: 59 | Attending: Emergency Medicine | Admitting: Emergency Medicine

## 2022-10-05 DIAGNOSIS — U071 COVID-19: Secondary | ICD-10-CM | POA: Insufficient documentation

## 2022-10-05 DIAGNOSIS — J45909 Unspecified asthma, uncomplicated: Secondary | ICD-10-CM | POA: Insufficient documentation

## 2022-10-05 DIAGNOSIS — R1110 Vomiting, unspecified: Secondary | ICD-10-CM | POA: Diagnosis present

## 2022-10-05 LAB — COMPREHENSIVE METABOLIC PANEL
ALT: 18 U/L (ref 0–44)
AST: 20 U/L (ref 15–41)
Albumin: 3.8 g/dL (ref 3.5–5.0)
Alkaline Phosphatase: 78 U/L (ref 38–126)
Anion gap: 10 (ref 5–15)
BUN: 7 mg/dL (ref 6–20)
CO2: 23 mmol/L (ref 22–32)
Calcium: 8.8 mg/dL — ABNORMAL LOW (ref 8.9–10.3)
Chloride: 103 mmol/L (ref 98–111)
Creatinine, Ser: 0.54 mg/dL (ref 0.44–1.00)
GFR, Estimated: >60 mL/min (ref >60)
Glucose, Bld: 140 mg/dL — ABNORMAL HIGH (ref 70–99)
Potassium: 3.4 mmol/L — ABNORMAL LOW (ref 3.5–5.1)
Sodium: 136 mmol/L (ref 135–145)
Total Bilirubin: 0.3 mg/dL (ref 0.3–1.2)
Total Protein: 6.8 g/dL (ref 6.5–8.1)

## 2022-10-05 LAB — CBC
HCT: 34.1 % — ABNORMAL LOW (ref 36.0–46.0)
Hemoglobin: 11.5 g/dL — ABNORMAL LOW (ref 12.0–15.0)
MCH: 28.6 pg (ref 26.0–34.0)
MCHC: 33.7 g/dL (ref 30.0–36.0)
MCV: 84.8 fL (ref 80.0–100.0)
Platelets: 274 10*3/uL (ref 150–400)
RBC: 4.02 MIL/uL (ref 3.87–5.11)
RDW: 14.3 % (ref 11.5–15.5)
WBC: 7.2 10*3/uL (ref 4.0–10.5)
nRBC: 0 % (ref 0.0–0.2)

## 2022-10-05 LAB — RESP PANEL BY RT-PCR (RSV, FLU A&B, COVID)  RVPGX2
Influenza A by PCR: NEGATIVE
Influenza B by PCR: NEGATIVE
Resp Syncytial Virus by PCR: NEGATIVE
SARS Coronavirus 2 by RT PCR: POSITIVE — AB

## 2022-10-05 LAB — HCG, SERUM, QUALITATIVE: Preg, Serum: NEGATIVE

## 2022-10-05 MED ORDER — ALBUTEROL SULFATE HFA 108 (90 BASE) MCG/ACT IN AERS
2.0000 | INHALATION_SPRAY | RESPIRATORY_TRACT | Status: DC | PRN
  Administered 2022-10-05: 2 via RESPIRATORY_TRACT

## 2022-10-05 MED ORDER — ONDANSETRON 8 MG PO TBDP: 8.0000 mg | ORAL_TABLET | Freq: Three times a day (TID) | ORAL | 0 refills | Status: DC | PRN

## 2022-10-05 MED ORDER — ALBUTEROL SULFATE HFA 108 (90 BASE) MCG/ACT IN AERS
INHALATION_SPRAY | RESPIRATORY_TRACT | Status: AC
  Filled 2022-10-05: qty 6.7

## 2022-10-05 MED ORDER — ONDANSETRON 4 MG PO TBDP
4.0000 mg | ORAL_TABLET | Freq: Once | ORAL | Status: AC | PRN
  Administered 2022-10-05: 4 mg via ORAL
  Filled 2022-10-05: qty 1

## 2022-10-05 MED ORDER — KETOROLAC TROMETHAMINE 15 MG/ML IJ SOLN
15.0000 mg | Freq: Once | INTRAMUSCULAR | Status: AC | PRN
  Administered 2022-10-05: 15 mg via INTRAVENOUS
  Filled 2022-10-05: qty 1

## 2022-10-05 MED ORDER — DIPHENHYDRAMINE HCL 50 MG/ML IJ SOLN
INTRAMUSCULAR | Status: AC
  Filled 2022-10-05: qty 1

## 2022-10-05 MED ORDER — DIPHENHYDRAMINE HCL 50 MG/ML IJ SOLN
25.0000 mg | Freq: Once | INTRAMUSCULAR | Status: AC
  Administered 2022-10-05: 25 mg via INTRAVENOUS

## 2022-10-05 MED ORDER — SODIUM CHLORIDE 0.9 % IV BOLUS
1000.0000 mL | Freq: Once | INTRAVENOUS | Status: AC
  Administered 2022-10-05: 1000 mL via INTRAVENOUS

## 2022-10-05 MED ORDER — PAXLOVID (300/100) 20 X 150 MG & 10 X 100MG PO TBPK: 3.0000 | ORAL_TABLET | Freq: Two times a day (BID) | ORAL | 0 refills | Status: AC

## 2022-10-05 MED ORDER — METOCLOPRAMIDE HCL 5 MG/ML IJ SOLN
10.0000 mg | Freq: Once | INTRAMUSCULAR | Status: AC
  Administered 2022-10-05: 10 mg via INTRAVENOUS
  Filled 2022-10-05: qty 2

## 2022-10-05 NOTE — Progress Notes (Signed)
Patient ambulatory for POV and now complains of Shortness of Breath with mild chest discomfort. Initailly presented to ED flu like symptoms. BBS diminished on exam with congested cough. Patient states she has history of asthma and her inhaler is now expired. Albuterol MDI ordered and administered per RT protocol.

## 2022-10-05 NOTE — ED Triage Notes (Signed)
Arrives POV from home. Ambulatory to triage.  Reports flu symptoms x24 hrs. Reports exposure to family with same. Complains of SOB, cough, emesis x5 today. Fever at home 103F (ear)-afebrile in triage.  HX asthma, HTN.

## 2022-10-05 NOTE — ED Notes (Signed)
Patient refused blood pressure

## 2022-10-05 NOTE — ED Provider Notes (Signed)
[facility] Provider Note: [name], MD, FACEP  CSN: [id number] MRN: [id number] ARRIVAL: 10/05/22 at 0008 ROOM: DB014/DB014   CHIEF COMPLAINT  Vomiting   HISTORY OF PRESENT ILLNESS  10/05/22 12:33 AM Rachel Soto is a 47 y.o. female with flulike symptoms for about the past 24 hours.  She has had exposure to family members with similar symptoms.  Specifically she is having shortness of breath, headache, chills, cough, fever to 103 and vomiting.  She estimates she has vomited about 5 times.  She was given Zofran ODT in triage prior to my evaluation with improvement.  She has had no abdominal pain or diarrhea.   Past Medical History:  Diagnosis Date   Asthma     Past Surgical History:  Procedure Laterality Date   CESAREAN SECTION      Family History  Problem Relation Age of Onset   Diabetes Mother    Fibromyalgia Mother    Hepatitis C Mother     Social History   Tobacco Use   Smoking status: Never   Smokeless tobacco: Never  Vaping Use   Vaping Use: Never used  Substance Use Topics   Alcohol use: No   Drug use: No    Prior to Admission medications   Medication Sig Start Date End Date Taking? Authorizing Provider  methocarbamol (ROBAXIN) 500 MG tablet Take 500 mg by mouth every 6 (six) hours as needed for muscle spasms. 09/20/22  Yes [provider]  nirmatrelvir & ritonavir (PAXLOVID, 300/100,) 20 x 150 MG & 10 x  TBPK Take 3 tablets by mouth 2 (two) times daily for 5 days. 10/05/22 10/10/22 Yes Cortny Bambach, Jonny Ruiz, MD  ondansetron (ZOFRAN-ODT) 8 MG disintegrating tablet Take 1 tablet (8 mg total) by mouth every 8 (eight) hours as needed for nausea or vomiting. 10/05/22  Yes Tyliek Timberman, MD  albuterol (VENTOLIN HFA) 108 (90 Base) MCG/ACT inhaler Inhale 2 puffs into the lungs every 4 (four) hours as needed for wheezing or shortness of breath. 02/02/22   Terald Sleeper, MD  benzonatate (TESSALON) 100 MG capsule Take 1 capsule (100 mg total) by  mouth every 8 (eight) hours. 08/23/21   Long, Arlyss Repress, MD  HYDROcodone-acetaminophen (NORCO) 5-325 MG tablet Take 2 tablets by mouth every 6 (six) hours as needed for severe pain. 09/06/22   Hendry Speas, MD  loratadine (CLARITIN) 10 MG tablet Take 10 mg by mouth daily.    [provider]  meclizine (ANTIVERT) 25 MG tablet Take 1 tablet (25 mg total) by mouth 3 (three) times daily as needed for dizziness. 07/12/22   Sabas Sous, MD    Allergies Patient has no known allergies.   REVIEW OF SYSTEMS  Negative except as noted here or in the History of Present Illness.   PHYSICAL EXAMINATION  Initial Vital Signs Blood pressure (!) 165/88, pulse 96, temperature 98.4 F (36.9 C), temperature source Oral, resp. rate 18, SpO2 99 %.  Examination General: Well-developed, well-nourished female in no acute distress; appearance consistent with age of record HENT: normocephalic; atraumatic Eyes: Normal appearance Neck: supple Heart: regular rate and rhythm Lungs: clear to auscultation bilaterally Abdomen: soft; nondistended; nontender; bowel sounds present Extremities: No deformity; full range of motion; pulses normal Neurologic: Awake, alert and oriented; motor function intact in all extremities and symmetric; no facial droop Skin: Warm and dry Psychiatric: Normal mood and affect   RESULTS  Summary of this visit's results, reviewed and interpreted by myself:   EKG Interpretation  Date/Time:  Friday October 05 2022 00:34:01 EDT Ventricular Rate:  94 PR Interval:  141 QRS Duration: 93 QT Interval:  360 QTC Calculation: 451 R Axis:   68 Text Interpretation: Sinus rhythm Normal ECG Rate is faster Confirmed by Isha Seefeld ([id number]) on 10/05/2022 12:40:31 AM       Laboratory Studies: Results for orders placed or performed during the hospital encounter of 10/05/22 (from the past 24 hour(s))  Comprehensive metabolic panel     Status: Abnormal   Collection Time: 10/05/22 12:28 AM   Result Value Ref Range   Sodium 136 135 - 145 mmol/L   Potassium 3.4 (L) 3.5 - 5.1 mmol/L   Chloride 103 98 - 111 mmol/L   CO2 23 22 - 32 mmol/L   Glucose, Bld 140 (H) 70 - 99 mg/dL   BUN 7 6 - 20 mg/dL   Creatinine, Ser 0.80 0.44 - 1.00 mg/dL   Calcium 8.8 (L) 8.9 - 10.3 mg/dL   Total Protein 6.8 6.5 - 8.1 g/dL   Albumin 3.8 3.5 - 5.0 g/dL   AST 20 15 - 41 U/L   ALT 18 0 - 44 U/L   Alkaline Phosphatase 78 38 - 126 U/L   Total Bilirubin 0.3 0.3 - 1.2 mg/dL   GFR, Estimated >22 >33 mL/min   Anion gap 10 5 - 15  CBC     Status: Abnormal   Collection Time: 10/05/22 12:28 AM  Result Value Ref Range   WBC 7.2 4.0 - 10.5 K/uL   RBC 4.02 3.87 - 5.11 MIL/uL   Hemoglobin 11.5 (L) 12.0 - 15.0 g/dL   HCT 61.2 (L) 24.4 - 97.5 %   MCV 84.8 80.0 - 100.0 fL   MCH 28.6 26.0 - 34.0 pg   MCHC 33.7 30.0 - 36.0 g/dL   RDW 30.0 51.1 - 02.1 %   Platelets 274 150 - 400 K/uL   nRBC 0.0 0.0 - 0.2 %  Resp panel by RT-PCR (RSV, Flu A&B, Covid) Anterior Nasal Swab     Status: Abnormal   Collection Time: 10/05/22 12:28 AM   Specimen: Anterior Nasal Swab  Result Value Ref Range   SARS Coronavirus 2 by RT PCR POSITIVE (A) NEGATIVE   Influenza A by PCR NEGATIVE NEGATIVE   Influenza B by PCR NEGATIVE NEGATIVE   Resp Syncytial Virus by PCR NEGATIVE NEGATIVE  hCG, serum, qualitative     Status: None   Collection Time: 10/05/22  1:02 AM  Result Value Ref Range   Preg, Serum NEGATIVE NEGATIVE   Imaging Studies: No results found.  ED COURSE and MDM  Nursing notes, initial and subsequent vitals signs, including pulse oximetry, reviewed and interpreted by myself.  Vitals:   10/05/22 0110 10/05/22 0140 10/05/22 0150 10/05/22 0210  BP:      Pulse: 92 83 77 82  Resp:      Temp:      TempSrc:      SpO2: 100% 99% 98% 93%   Medications  albuterol (VENTOLIN HFA) 108 (90 Base) MCG/ACT inhaler 2 puff (2 puffs Inhalation Given 10/05/22 0048)  albuterol (VENTOLIN HFA) 108 (90 Base) MCG/ACT inhaler (  Not  Given 10/05/22 0050)  diphenhydrAMINE (BENADRYL) 50 MG/ML injection (has no administration in time range)  ondansetron (ZOFRAN-ODT) disintegrating tablet 4 mg (4 mg Oral Given 10/05/22 0027)  metoCLOPramide (REGLAN) injection 10 mg (10 mg Intravenous Given 10/05/22 0104)  sodium chloride 0.9 % bolus 1,000 mL (0 mLs Intravenous Stopped 10/05/22 0218)  ketorolac (TORADOL) 15  MG/ML injection 15 mg (15 mg Intravenous Given 10/05/22 0137)  diphenhydrAMINE (BENADRYL) injection 25 mg (25 mg Intravenous Given 10/05/22 0120)   2:32 AM Patient feeling better and able to sleep.  She is positive for COVID and would like to be started on Paxlovid; she does have a history of asthma which is a risk factor.  She was given a new albuterol inhaler in the ED.   PROCEDURES  Procedures   ED DIAGNOSES     ICD-10-CM   1. COVID-19 virus infection  U07.1          Delissa Silba, MD 10/05/22 [phone]

## 2022-11-23 DIAGNOSIS — Z7951 Long term (current) use of inhaled steroids: Secondary | ICD-10-CM | POA: Diagnosis not present

## 2022-11-23 DIAGNOSIS — Z79899 Other long term (current) drug therapy: Secondary | ICD-10-CM | POA: Insufficient documentation

## 2022-11-23 DIAGNOSIS — G44209 Tension-type headache, unspecified, not intractable: Secondary | ICD-10-CM | POA: Diagnosis not present

## 2022-11-23 DIAGNOSIS — J45909 Unspecified asthma, uncomplicated: Secondary | ICD-10-CM | POA: Diagnosis not present

## 2022-11-23 DIAGNOSIS — I10 Essential (primary) hypertension: Secondary | ICD-10-CM | POA: Insufficient documentation

## 2022-11-23 DIAGNOSIS — R519 Headache, unspecified: Secondary | ICD-10-CM | POA: Diagnosis present

## 2022-11-24 ENCOUNTER — Other Ambulatory Visit: Payer: Self-pay

## 2022-11-24 ENCOUNTER — Emergency Department (HOSPITAL_BASED_OUTPATIENT_CLINIC_OR_DEPARTMENT_OTHER)
Admission: EM | Admit: 2022-11-24 | Discharge: 2022-11-24 | Disposition: A | Discharge status: Home / Self Care | Payer: 59 | Attending: Emergency Medicine | Admitting: Emergency Medicine

## 2022-11-24 DIAGNOSIS — G44209 Tension-type headache, unspecified, not intractable: Secondary | ICD-10-CM

## 2022-11-24 DIAGNOSIS — I10 Essential (primary) hypertension: Secondary | ICD-10-CM

## 2022-11-24 MED ORDER — AMLODIPINE BESYLATE 5 MG PO TABS: 5.0000 mg | ORAL_TABLET | Freq: Every day | ORAL | 0 refills | Status: DC

## 2022-11-24 MED ORDER — AMLODIPINE BESYLATE 5 MG PO TABS
5.0000 mg | ORAL_TABLET | Freq: Once | ORAL | Status: AC
  Administered 2022-11-24: 5 mg via ORAL
  Filled 2022-11-24: qty 1

## 2022-11-24 MED ORDER — KETOROLAC TROMETHAMINE 30 MG/ML IJ SOLN
30.0000 mg | Freq: Once | INTRAMUSCULAR | Status: AC
  Administered 2022-11-24: 30 mg via INTRAMUSCULAR
  Filled 2022-11-24: qty 1

## 2022-11-24 NOTE — Discharge Instructions (Signed)
You were seen today for headache and concerns for high blood pressure.  It is important that you get your medications filled.  I will provide you with a 7-day course of blood pressure medication if you feel that you can get this filled and paid for.  In the meantime, monitor your blood pressure and symptoms closely at home.

## 2022-11-24 NOTE — ED Provider Notes (Signed)
[place] EMERGENCY DEPARTMENT AT [facility] Provider Note   CSN: [id number] Arrival date & time: 11/23/22  2358     History  Chief Complaint  Patient presents with   Hypertension   Headache    Rachel Soto is a 47 y.o. female.  HPI     This is a 47 year old female who presents with concerns for headache and high blood pressure.  Patient reports history of high blood pressure.  She states that she was just recently able to get into her primary care physician.  At that time she had been taking blood pressure medications from urgent care.  She was feeling quite weak and was noted to have hypotension.  For this reason, the primary physician took her off the blood pressure medication from the urgent care and prescribe something different.  She states she has been unable to pick this up secondary to financial issues.  Since that time she has noted her blood pressure trending upward.  Since this morning she has had a pounding headache.  She describes it as pounding and over the top of her head.  She tried to take a nap and took over-the-counter medication with minimal relief.  Reports that she has headache history mostly with her menstrual cycles and with high blood pressure.  Denies any focal disturbances or strokelike symptoms.  Home Medications Prior to Admission medications   Medication Sig Start Date End Date Taking? Authorizing Provider  amLODipine (NORVASC) 5 MG tablet Take 1 tablet (5 mg total) by mouth daily. 11/24/22  Yes Tien Spooner, Mayer Masker, MD  albuterol (VENTOLIN HFA) 108 (90 Base) MCG/ACT inhaler Inhale 2 puffs into the lungs every 4 (four) hours as needed for wheezing or shortness of breath. 02/02/22   Terald Sleeper, MD  benzonatate (TESSALON) 100 MG capsule Take 1 capsule (100 mg total) by mouth every 8 (eight) hours. 08/23/21   Long, Arlyss Repress, MD  HYDROcodone-acetaminophen (NORCO) 5-325 MG tablet Take 2 tablets by mouth every 6 (six) hours as needed for severe  pain. 09/06/22   Molpus, John, MD  loratadine (CLARITIN) 10 MG tablet Take 10 mg by mouth daily.    [provider]  meclizine (ANTIVERT) 25 MG tablet Take 1 tablet (25 mg total) by mouth 3 (three) times daily as needed for dizziness. 07/12/22   Sabas Sous, MD  methocarbamol (ROBAXIN) 500 MG tablet Take 500 mg by mouth every 6 (six) hours as needed for muscle spasms. 09/20/22   [provider]  ondansetron (ZOFRAN-ODT) 8 MG disintegrating tablet Take 1 tablet (8 mg total) by mouth every 8 (eight) hours as needed for nausea or vomiting. 10/05/22   Molpus, Jonny Ruiz, MD      Allergies    Patient has no known allergies.    Review of Systems   Review of Systems  Constitutional:  Negative for fever.  Neurological:  Positive for headaches. Negative for weakness, light-headedness and numbness.  All other systems reviewed and are negative.   Physical Exam Updated Vital Signs BP (!) 144/64   Pulse (!) 54   Temp 98.5 F (36.9 C) (Oral)   Resp 18   Ht 1.6 m (5\' 3" )   Wt (!) 147.4 kg   LMP 11/17/2022 (Exact Date)   SpO2 98%   BMI 57.57 kg/m  Physical Exam Vitals and nursing note reviewed.  Constitutional:      Appearance: She is well-developed. She is obese. She is not ill-appearing.  HENT:     Head: Normocephalic  and atraumatic.  Eyes:     Pupils: Pupils are equal, round, and reactive to light.  Cardiovascular:     Rate and Rhythm: Normal rate and regular rhythm.     Heart sounds: Normal heart sounds.  Pulmonary:     Effort: Pulmonary effort is normal. No respiratory distress.     Breath sounds: No wheezing.  Abdominal:     Palpations: Abdomen is soft.  Musculoskeletal:     Cervical back: Neck supple.  Skin:    General: Skin is warm and dry.  Neurological:     Mental Status: She is alert and oriented to person, place, and time.     Comments: Cranial nerves II through XII intact, 5 out of 5 strength in all 4 extremities, no dysmetria to finger-nose-finger      ED Results / Procedures / Treatments   Labs (all labs ordered are listed, but only abnormal results are displayed) Labs Reviewed - No data to display  EKG None  Radiology No results found.  Procedures Procedures    Medications Ordered in ED Medications  ketorolac (TORADOL) 30 MG/ML injection 30 mg (30 mg Intramuscular Given 11/24/22 0042)  amLODipine (NORVASC) tablet 5 mg (5 mg Oral Given 11/24/22 0042)    ED Course/ Medical Decision Making/ A&P                             Medical Decision Making Risk Prescription drug management.   This patient presents to the ED for concern of high blood pressure, headache, this involves an extensive number of treatment options, and is a complaint that carries with it a high risk of complications and morbidity.  I considered the following differential and admission for this acute, potentially life threatening condition.  The differential diagnosis includes central hypertension, secondary hypertension, hypertensive urgency, hypertensive emergency, tension headache, migraine, less likely subarachnoid hemorrhage  MDM:    This is a 47 year old female with history of hypertension who presents with concerns for high blood pressure and headache.  She is nontoxic.  No focal findings on exam.  Low suspicion for hypertensive urgency or emergency or subarachnoid hemorrhage.  Patient's blood pressure down trended from 176/80 to 140s over 80s without intervention.  She was given 5 mg of Norvasc.  She was given IM Toradol for her headache.  On recheck, she had complete resolution of symptoms.  Repeat blood pressure 144/64.  I suspect she does have chronic hypertension and does need a medication.  I have given her 1 week prescription for Norvasc to bridge her to picking up her other blood pressure medication.  She will monitor her symptoms closely at home.  (Labs, imaging, consults)  Labs: I Ordered, and personally interpreted labs.  The pertinent  results include: None  Imaging Studies ordered: I ordered imaging studies including none I independently visualized and interpreted imaging. I agree with the radiologist interpretation  Additional history obtained from family at bedside.  External records from outside source obtained and reviewed including evaluations  Cardiac Monitoring: The patient was maintained on a cardiac monitor.  If on the cardiac monitor, I personally viewed and interpreted the cardiac monitored which showed an underlying rhythm of: Sinus rhythm  Reevaluation: After the interventions noted above, I reevaluated the patient and found that they have :improved  Social Determinants of Health:  lives independently  Disposition: Discharge  Co morbidities that complicate the patient evaluation  Past Medical History:  Diagnosis Date  Asthma      Medicines Meds ordered this encounter  Medications   ketorolac (TORADOL) 30 MG/ML injection 30 mg   amLODipine (NORVASC) tablet 5 mg   amLODipine (NORVASC) 5 MG tablet    Sig: Take 1 tablet (5 mg total) by mouth daily.    Dispense:  15 tablet    Refill:  0    I have reviewed the patients home medicines and have made adjustments as needed  Problem List / ED Course: Problem List Items Addressed This Visit   None Visit Diagnoses     Tension headache    -  Primary   Relevant Medications   ketorolac (TORADOL) 30 MG/ML injection 30 mg (Completed)   amLODipine (NORVASC) tablet 5 mg (Completed)   amLODipine (NORVASC) 5 MG tablet   Hypertension, unspecified type       Relevant Medications   amLODipine (NORVASC) tablet 5 mg (Completed)   amLODipine (NORVASC) 5 MG tablet                   Final Clinical Impression(s) / ED Diagnoses Final diagnoses:  Tension headache  Hypertension, unspecified type    Rx / DC Orders ED Discharge Orders          Ordered    amLODipine (NORVASC) 5 MG tablet  Daily        11/24/22 0201               Shon Baton, MD 11/24/22 0206

## 2022-11-24 NOTE — ED Triage Notes (Signed)
Pt POV from home reporting 10/10 headache and "ear throbbing" since this morning. Pt reports seeing new primary care provider 5/24 and being put on BP meds which she hasn't been able to pick up yet.

## 2022-12-20 ENCOUNTER — Emergency Department (HOSPITAL_BASED_OUTPATIENT_CLINIC_OR_DEPARTMENT_OTHER)
Admission: EM | Admit: 2022-12-20 | Discharge: 2022-12-21 | Disposition: A | Discharge status: Home / Self Care | Payer: 59 | Attending: Emergency Medicine | Admitting: Emergency Medicine

## 2022-12-20 ENCOUNTER — Emergency Department (HOSPITAL_BASED_OUTPATIENT_CLINIC_OR_DEPARTMENT_OTHER): Payer: 59

## 2022-12-20 ENCOUNTER — Encounter (HOSPITAL_BASED_OUTPATIENT_CLINIC_OR_DEPARTMENT_OTHER): Payer: Self-pay | Admitting: Urology

## 2022-12-20 DIAGNOSIS — R030 Elevated blood-pressure reading, without diagnosis of hypertension: Secondary | ICD-10-CM | POA: Diagnosis not present

## 2022-12-20 DIAGNOSIS — R109 Unspecified abdominal pain: Secondary | ICD-10-CM | POA: Insufficient documentation

## 2022-12-20 DIAGNOSIS — I10 Essential (primary) hypertension: Secondary | ICD-10-CM | POA: Diagnosis not present

## 2022-12-20 DIAGNOSIS — R519 Headache, unspecified: Secondary | ICD-10-CM | POA: Diagnosis not present

## 2022-12-20 DIAGNOSIS — R110 Nausea: Secondary | ICD-10-CM | POA: Insufficient documentation

## 2022-12-20 DIAGNOSIS — M545 Low back pain, unspecified: Secondary | ICD-10-CM | POA: Insufficient documentation

## 2022-12-20 DIAGNOSIS — Z79899 Other long term (current) drug therapy: Secondary | ICD-10-CM | POA: Insufficient documentation

## 2022-12-20 LAB — URINALYSIS, ROUTINE W REFLEX MICROSCOPIC
Bilirubin Urine: NEGATIVE
Glucose, UA: NEGATIVE mg/dL
Ketones, ur: NEGATIVE mg/dL
Nitrite: NEGATIVE
Protein, ur: NEGATIVE mg/dL
Specific Gravity, Urine: 1.015 (ref 1.005–1.030)
pH: 7 (ref 5.0–8.0)

## 2022-12-20 LAB — URINALYSIS, MICROSCOPIC (REFLEX)

## 2022-12-20 LAB — PREGNANCY, URINE: Preg Test, Ur: NEGATIVE

## 2022-12-20 MED ORDER — OXYCODONE-ACETAMINOPHEN 5-325 MG PO TABS
1.0000 | ORAL_TABLET | Freq: Once | ORAL | Status: AC
  Administered 2022-12-20: 1 via ORAL
  Filled 2022-12-20: qty 1

## 2022-12-20 NOTE — ED Notes (Signed)
Pt to bathroom to collect urine sample

## 2022-12-20 NOTE — ED Provider Notes (Signed)
[place] EMERGENCY DEPARTMENT AT [facility] Provider Note   CSN: [id number] Arrival date & time: 12/20/22  2253     History  Chief Complaint  Patient presents with   Flank Pain    Rachel Soto is a 47 y.o. female.  The history is provided by the patient and medical records.  Flank Pain  Rachel Soto is a 47 y.o. female who presents to the Emergency Department complaining of flank pain.  She presents to the emergency department complaining of 3 days of left flank pain.  She has been treating it with drinking cranberry juice and water due to concern that it might be a developing urinary tract infection.  She does have somewhat associated headache, nausea.  Her pain is worse with walking.  No fever but she is sweaty at times when she wakes.  When she goes to walk pain is located in the bilateral low back and is stabbing in nature.  It does not radiate.  No abdominal pain, diarrhea, constipation, leg weakness, numbness, pain.  She does have urinary frequency but no dysuria.  She does feel like she can completely void her bladder.  Hx/o HTN      Home Medications Prior to Admission medications   Medication Sig Start Date End Date Taking? Authorizing Provider  methocarbamol (ROBAXIN) 500 MG tablet Take 1 tablet (500 mg total) by mouth every 8 (eight) hours as needed for muscle spasms. 12/21/22  Yes Tilden Fossa, MD  albuterol (VENTOLIN HFA) 108 (90 Base) MCG/ACT inhaler Inhale 2 puffs into the lungs every 4 (four) hours as needed for wheezing or shortness of breath. 02/02/22   Terald Sleeper, MD  amLODipine (NORVASC) 5 MG tablet Take 1 tablet (5 mg total) by mouth daily. 11/24/22   Horton, Mayer Masker, MD  benzonatate (TESSALON) 100 MG capsule Take 1 capsule (100 mg total) by mouth every 8 (eight) hours. 08/23/21   Long, Arlyss Repress, MD  HYDROcodone-acetaminophen (NORCO) 5-325 MG tablet Take 2 tablets by mouth every 6 (six) hours as needed for severe pain. 09/06/22    Molpus, John, MD  loratadine (CLARITIN) 10 MG tablet Take 10 mg by mouth daily.    [provider]  meclizine (ANTIVERT) 25 MG tablet Take 1 tablet (25 mg total) by mouth 3 (three) times daily as needed for dizziness. 07/12/22   Sabas Sous, MD  ondansetron (ZOFRAN-ODT) 8 MG disintegrating tablet Take 1 tablet (8 mg total) by mouth every 8 (eight) hours as needed for nausea or vomiting. 10/05/22   Molpus, Jonny Ruiz, MD      Allergies    Patient has no known allergies.    Review of Systems   Review of Systems  Genitourinary:  Positive for flank pain.  All other systems reviewed and are negative.   Physical Exam Updated Vital Signs BP (!) 154/90   Pulse 81   Temp 98.3 F (36.8 C) (Oral)   Resp 18   Ht 5\' 3"  (1.6 m)   Wt (!) 148 kg   LMP 12/18/2022 (Exact Date)   SpO2 98%   BMI 57.80 kg/m  Physical Exam Vitals and nursing note reviewed.  Constitutional:      Appearance: She is well-developed.  HENT:     Head: Normocephalic and atraumatic.  Cardiovascular:     Rate and Rhythm: Normal rate and regular rhythm.  Pulmonary:     Effort: Pulmonary effort is normal. No respiratory distress.  Abdominal:     Palpations: Abdomen is soft.  Tenderness: There is no abdominal tenderness. There is no guarding or rebound.  Musculoskeletal:        General: No swelling.     Comments: 2+DP pulses bilaterally.  TTP over bilateral flanks.  No midline spine tenderness to palpation   Skin:    General: Skin is warm and dry.  Neurological:     Mental Status: She is alert and oriented to person, place, and time.     Comments: 5/5 strength in BLE with sensation to light touch intact in BLE.  No saddle anesthesia.   Psychiatric:        Behavior: Behavior normal.     ED Results / Procedures / Treatments   Labs (all labs ordered are listed, but only abnormal results are displayed) Labs Reviewed  URINALYSIS, ROUTINE W REFLEX MICROSCOPIC - Abnormal; Notable for the following  components:      Result Value   Hgb urine dipstick LARGE (*)    Leukocytes,Ua SMALL (*)    All other components within normal limits  URINALYSIS, MICROSCOPIC (REFLEX) - Abnormal; Notable for the following components:   Bacteria, UA RARE (*)    All other components within normal limits  COMPREHENSIVE METABOLIC PANEL - Abnormal; Notable for the following components:   Glucose, Bld 142 (*)    Calcium 8.8 (*)    All other components within normal limits  CBC WITH DIFFERENTIAL/PLATELET - Abnormal; Notable for the following components:   WBC 10.6 (*)    Hemoglobin 11.5 (*)    HCT 34.7 (*)    All other components within normal limits  PREGNANCY, URINE    EKG None  Radiology CT Renal Stone Study  Result Date: 12/21/2022 CLINICAL DATA:  Hematuria and flank pain x3 days. EXAM: CT ABDOMEN AND PELVIS WITHOUT CONTRAST TECHNIQUE: Multidetector CT imaging of the abdomen and pelvis was performed following the standard protocol without IV contrast. RADIATION DOSE REDUCTION: This exam was performed according to the departmental dose-optimization program which includes automated exposure control, adjustment of the mA and/or kV according to patient size and/or use of iterative reconstruction technique. COMPARISON:  September 01, 2020 FINDINGS: Lower chest: No acute abnormality. Hepatobiliary: There is diffuse fatty infiltration of the liver parenchyma. No focal liver abnormality is seen. The gallbladder is contracted. No gallstones, gallbladder wall thickening, or biliary dilatation. Pancreas: Unremarkable. No pancreatic ductal dilatation or surrounding inflammatory changes. Spleen: Normal in size without focal abnormality. Adrenals/Urinary Tract: Adrenal glands are unremarkable. Kidneys are normal, without renal calculi, focal lesion, or hydronephrosis. Bladder is unremarkable. Stomach/Bowel: Stomach is within normal limits. Appendix appears normal. No evidence of bowel wall thickening, distention, or  inflammatory changes. Vascular/Lymphatic: No significant vascular findings are present. No enlarged abdominal or pelvic lymph nodes. Reproductive: Uterus and bilateral adnexa are unremarkable. Other: No abdominal wall hernia or abnormality. No abdominopelvic ascites. Musculoskeletal: No acute or significant osseous findings. IMPRESSION: 1. Hepatic steatosis. 2. No evidence of nephrolithiasis or obstructive uropathy. Electronically Signed   By: Aram Candela M.D.   On: 12/21/2022 00:17    Procedures Procedures    Medications Ordered in ED Medications  oxyCODONE-acetaminophen (PERCOCET/ROXICET) 5-325 MG per tablet 1 tablet (1 tablet Oral Given 12/20/22 2359)  ketorolac (TORADOL) injection 60 mg (60 mg Intramuscular Given 12/21/22 0155)    ED Course/ Medical Decision Making/ A&P                             Medical Decision Making Amount and/or Complexity  of Data Reviewed Labs: ordered. Radiology: ordered.  Risk Prescription drug management.   Patient here for evaluation of 3 days of atraumatic low back pain.  She is neurovascularly intact on examination.  UA is not consistent with UTI.  CT scan is negative for acute obstructing stone.  CBC with mild leukocytosis-no current evidence of acute infectious process.  She also has borderline anemia-similar when compared to priors.  Of note she is hypertensive in the emergency department.  She does have a history of hypertension but has medications that she takes as needed, but does not currently have a blood pressure cuff.  No evidence of hypertensive urgency or acute endorgan dysfunction at this time.  Discussed recommendation for following up with her PCP regarding her blood pressures.  In terms of her back pain, suspect this is musculoskeletal in nature.  Current clinical picture is not consistent with acute cord compression.  Patient does feel improved after pain medications in the emergency department.  Discussed home care for low back pain  with rest, OTC analgesics.  Will prescribe Robaxin as needed muscle spasms.  Discussed outpatient follow-up and return precautions.        Final Clinical Impression(s) / ED Diagnoses Final diagnoses:  Acute bilateral low back pain without sciatica  Elevated blood pressure reading    Rx / DC Orders ED Discharge Orders          Ordered    methocarbamol (ROBAXIN) 500 MG tablet  Every 8 hours PRN        12/21/22 0136              Tilden Fossa, MD 12/21/22 [id number]

## 2022-12-20 NOTE — ED Triage Notes (Signed)
Pt states was at [facility] in [place] and sent here for r/o kidney stone States blood in urine  States left side flank pain x 3 days

## 2022-12-21 LAB — COMPREHENSIVE METABOLIC PANEL
ALT: 24 U/L (ref 0–44)
AST: 28 U/L (ref 15–41)
Albumin: 3.5 g/dL (ref 3.5–5.0)
Alkaline Phosphatase: 87 U/L (ref 38–126)
Anion gap: 10 (ref 5–15)
BUN: 11 mg/dL (ref 6–20)
CO2: 24 mmol/L (ref 22–32)
Calcium: 8.8 mg/dL — ABNORMAL LOW (ref 8.9–10.3)
Chloride: 101 mmol/L (ref 98–111)
Creatinine, Ser: 0.54 mg/dL (ref 0.44–1.00)
GFR, Estimated: >60 mL/min (ref >60)
Glucose, Bld: 142 mg/dL — ABNORMAL HIGH (ref 70–99)
Potassium: 3.7 mmol/L (ref 3.5–5.1)
Sodium: 135 mmol/L (ref 135–145)
Total Bilirubin: 0.4 mg/dL (ref 0.3–1.2)
Total Protein: 7.1 g/dL (ref 6.5–8.1)

## 2022-12-21 LAB — CBC WITH DIFFERENTIAL/PLATELET
Abs Immature Granulocytes: 0.05 10*3/uL (ref 0.00–0.07)
Basophils Absolute: 0 10*3/uL (ref 0.0–0.1)
Basophils Relative: 0 %
Eosinophils Absolute: 0.2 10*3/uL (ref 0.0–0.5)
Eosinophils Relative: 2 %
HCT: 34.7 % — ABNORMAL LOW (ref 36.0–46.0)
Hemoglobin: 11.5 g/dL — ABNORMAL LOW (ref 12.0–15.0)
Immature Granulocytes: 1 %
Lymphocytes Relative: 23 %
Lymphs Abs: 2.4 10*3/uL (ref 0.7–4.0)
MCH: 28.2 pg (ref 26.0–34.0)
MCHC: 33.1 g/dL (ref 30.0–36.0)
MCV: 85 fL (ref 80.0–100.0)
Monocytes Absolute: 0.6 10*3/uL (ref 0.1–1.0)
Monocytes Relative: 6 %
Neutro Abs: 7.2 10*3/uL (ref 1.7–7.7)
Neutrophils Relative %: 68 %
Platelets: 339 10*3/uL (ref 150–400)
RBC: 4.08 MIL/uL (ref 3.87–5.11)
RDW: 14 % (ref 11.5–15.5)
WBC: 10.6 10*3/uL — ABNORMAL HIGH (ref 4.0–10.5)
nRBC: 0 % (ref 0.0–0.2)

## 2022-12-21 MED ORDER — KETOROLAC TROMETHAMINE 60 MG/2ML IM SOLN
60.0000 mg | Freq: Once | INTRAMUSCULAR | Status: AC
  Administered 2022-12-21: 60 mg via INTRAMUSCULAR
  Filled 2022-12-21: qty 2

## 2022-12-21 MED ORDER — METHOCARBAMOL 500 MG PO TABS: 500.0000 mg | ORAL_TABLET | Freq: Three times a day (TID) | ORAL | 0 refills | Status: DC | PRN

## 2022-12-29 DIAGNOSIS — G4489 Other headache syndrome: Secondary | ICD-10-CM | POA: Insufficient documentation

## 2022-12-29 DIAGNOSIS — J45909 Unspecified asthma, uncomplicated: Secondary | ICD-10-CM | POA: Diagnosis not present

## 2022-12-29 DIAGNOSIS — R519 Headache, unspecified: Secondary | ICD-10-CM | POA: Diagnosis present

## 2022-12-29 DIAGNOSIS — I10 Essential (primary) hypertension: Secondary | ICD-10-CM | POA: Diagnosis not present

## 2022-12-30 ENCOUNTER — Emergency Department (HOSPITAL_BASED_OUTPATIENT_CLINIC_OR_DEPARTMENT_OTHER)
Admission: EM | Admit: 2022-12-30 | Discharge: 2022-12-30 | Disposition: A | Discharge status: Home / Self Care | Payer: 59 | Attending: Emergency Medicine | Admitting: Emergency Medicine

## 2022-12-30 ENCOUNTER — Emergency Department (HOSPITAL_BASED_OUTPATIENT_CLINIC_OR_DEPARTMENT_OTHER): Payer: 59

## 2022-12-30 ENCOUNTER — Other Ambulatory Visit: Payer: Self-pay

## 2022-12-30 ENCOUNTER — Encounter (HOSPITAL_BASED_OUTPATIENT_CLINIC_OR_DEPARTMENT_OTHER): Payer: Self-pay

## 2022-12-30 DIAGNOSIS — G4489 Other headache syndrome: Secondary | ICD-10-CM

## 2022-12-30 MED ORDER — KETOROLAC TROMETHAMINE 15 MG/ML IJ SOLN
30.0000 mg | Freq: Once | INTRAMUSCULAR | Status: AC
  Administered 2022-12-30: 30 mg via INTRAMUSCULAR
  Filled 2022-12-30: qty 2

## 2022-12-30 NOTE — ED Notes (Signed)
Patient transported to CT 

## 2022-12-30 NOTE — ED Provider Notes (Signed)
[place] EMERGENCY DEPARTMENT AT [facility] Provider Note   CSN: [id number] Arrival date & time: 12/29/22  2353     History  Chief Complaint  Patient presents with   Migraine    Rachel Soto is a 47 y.o. female.  The history is provided by the patient.  Patient with history of asthma, hypertension presents for her sixth ER visit in 6 months.  She reports she has had a headache for the past 2-3 days.  She reports associated nausea without vomiting.  No fevers.  No focal weakness.  No visual changes.  The pain was gradual in onset in the left side of her scalp.  No recent trauma.  She reports she has been treating her blood pressure with valsartan per her PCP Dr. Duanne Guess No previous h/o CAD/CVA. She reports getting the headaches multiple times per month and has never been evaluated. No recent travel.  No tick bites or rashes. She reports the headaches disrupt her sleep   Past Medical History:  Diagnosis Date   Asthma     Home Medications Prior to Admission medications   Medication Sig Start Date End Date Taking? Authorizing Provider  albuterol (VENTOLIN HFA) 108 (90 Base) MCG/ACT inhaler Inhale 2 puffs into the lungs every 4 (four) hours as needed for wheezing or shortness of breath. 02/02/22   Terald Sleeper, MD  loratadine (CLARITIN) 10 MG tablet Take 10 mg by mouth daily.    [provider]      Allergies    Patient has no known allergies.    Review of Systems   Review of Systems  Constitutional:  Negative for fever.  Eyes:  Negative for visual disturbance.  Gastrointestinal:  Negative for vomiting.  Neurological:  Positive for headaches. Negative for facial asymmetry, weakness and numbness.  Psychiatric/Behavioral:  Positive for sleep disturbance.     Physical Exam Updated Vital Signs BP (!) 135/54   Pulse 82   Temp 98.2 F (36.8 C)   Resp 18   Ht 1.6 m (5\' 3" )   Wt (!) 146.5 kg   LMP 12/18/2022 (Exact Date)   SpO2 98%   BMI 57.22  kg/m  Physical Exam CONSTITUTIONAL: Well developed/well nourished HEAD: Normocephalic/atraumatic, no obvious rash to scalp EYES: EOMI/PERRL, no nystagmus, no ptosis, no visual field deficit ENMT: Mucous membranes moist NECK: supple no meningeal signs, no bruits CV: S1/S2 noted, no murmurs/rubs/gallops noted LUNGS: Lungs are clear to auscultation bilaterally, no apparent distress ABDOMEN: soft NEURO:Awake/alert, face symmetric, no arm or leg drift is noted Equal 5/5 strength with shoulder abduction, elbow flex/extension, wrist flex/extension in upper extremities and equal hand grips bilaterally Equal 5/5 strength with hip flexion,knee flex/extension, foot dorsi/plantar flexion Cranial nerves 3/4/5/6/12/31/08/11/12 tested and intact Gait normal without ataxia No past pointing Sensation to light touch intact in all extremities SKIN: warm, color normal PSYCH: no abnormalities of mood noted, alert and oriented to situation  ED Results / Procedures / Treatments   Labs (all labs ordered are listed, but only abnormal results are displayed) Labs Reviewed - No data to display  EKG None  Radiology CT Head Wo Contrast  Result Date: 12/30/2022 CLINICAL DATA:  Migrated EXAM: CT HEAD WITHOUT CONTRAST TECHNIQUE: Contiguous axial images were obtained from the base of the skull through the vertex without intravenous contrast. RADIATION DOSE REDUCTION: This exam was performed according to the departmental dose-optimization program which includes automated exposure control, adjustment of the mA and/or kV according to patient size and/or use of iterative  reconstruction technique. COMPARISON:  None Available. FINDINGS: Brain: There is no mass, hemorrhage or extra-axial collection. The size and configuration of the ventricles and extra-axial CSF spaces are normal. The brain parenchyma is normal, without acute or chronic infarction. Vascular: No abnormal hyperdensity of the major intracranial arteries or dural  venous sinuses. No intracranial atherosclerosis. Skull: The visualized skull base, calvarium and extracranial soft tissues are normal. Sinuses/Orbits: No fluid levels or advanced mucosal thickening of the visualized paranasal sinuses. No mastoid or middle ear effusion. The orbits are normal. IMPRESSION: Normal head CT. Electronically Signed   By: Deatra Robinson M.D.   On: 12/30/2022 03:01    Procedures Procedures    Medications Ordered in ED Medications  ketorolac (TORADOL) 15 MG/ML injection 30 mg (30 mg Intramuscular Given 12/30/22 0239)    ED Course/ Medical Decision Making/ A&P Clinical Course as of 12/30/22 0318  Wynelle Link Dec 30, 2022  0238 Patient presents for recurrent left-sided headache.  She has no signs of trauma, no rash to her scalp.  No focal neurodeficits, she is ambulatory patient reports she gets these headaches frequently.  She has never had a thorough evaluation of her headache.  Will obtain CT head for screening purposes.  Will also give Toradol for headache as she has had good response in the past. Patient   was recently placed on valsartan has had improving blood pressure control [DW]  0317 Patient overall well-appearing, feels improved.  CT head is unremarkable.  Will refer to neurology due to frequent headaches.  Low suspicion for occult neurologic emergency.  We discussed strict ER return precautions [DW]    Clinical Course User Index [DW] Zadie Rhine, MD                             Medical Decision Making Amount and/or Complexity of Data Reviewed Radiology: ordered.  Risk Prescription drug management.   This patient presents to the ED for concern of headache, this involves an extensive number of treatment options, and is a complaint that carries with it a high risk of complications and morbidity.  The differential diagnosis includes but is not limited to subarachnoid hemorrhage, intracranial hemorrhage, meningitis, encephalitis, CVST, temporal arteritis,  idiopathic intracranial hypertension, migraine    Comorbidities that complicate the patient evaluation: Patient's presentation is complicated by their history of hypertension and asthma  Social Determinants of Health: Patient's  difficulty affording medications   increases the complexity of managing their presentation  Additional history obtained: Records reviewed Care Everywhere/External Records  Imaging Studies ordered: I ordered imaging studies including CT scan head   I independently visualized and interpreted imaging which showed no acute findings I agree with the radiologist interpretation   Medicines ordered and prescription drug management: I ordered medication including Toradol for headache Reevaluation of the patient after these medicines showed that the patient    improved  Test Considered: Given history of frequent headaches, lack of neurologic findings, and a negative CT head, will defer further workup.  Will defer MRI at this time  Reevaluation: After the interventions noted above, I reevaluated the patient and found that they have :improved  Complexity of problems addressed: Patient's presentation is most consistent with  acute presentation with potential threat to life or bodily function  Disposition: After consideration of the diagnostic results and the patient's response to treatment,  I feel that the patent would benefit from discharge   .  Final Clinical Impression(s) / ED Diagnoses Final diagnoses:  Other headache syndrome    Rx / DC Orders ED Discharge Orders     None         Zadie Rhine, MD 12/30/22 [phone]

## 2022-12-30 NOTE — Discharge Instructions (Signed)

## 2022-12-30 NOTE — ED Triage Notes (Signed)
POV from home, A&O x 4, GCS 15, amb to triage  Migraine x 3 days with nausea, denies lightheadedness or vision changes. Hx of same

## 2023-08-16 ENCOUNTER — Emergency Department (HOSPITAL_BASED_OUTPATIENT_CLINIC_OR_DEPARTMENT_OTHER)
Admission: EM | Admit: 2023-08-16 | Discharge: 2023-08-17 | Disposition: A | Discharge status: Home / Self Care | Payer: 59 | Attending: Emergency Medicine | Admitting: Emergency Medicine

## 2023-08-16 ENCOUNTER — Encounter (HOSPITAL_BASED_OUTPATIENT_CLINIC_OR_DEPARTMENT_OTHER): Payer: Self-pay | Admitting: Emergency Medicine

## 2023-08-16 ENCOUNTER — Emergency Department (HOSPITAL_BASED_OUTPATIENT_CLINIC_OR_DEPARTMENT_OTHER): Payer: 59 | Admitting: Radiology

## 2023-08-16 DIAGNOSIS — R509 Fever, unspecified: Secondary | ICD-10-CM | POA: Diagnosis present

## 2023-08-16 DIAGNOSIS — J101 Influenza due to other identified influenza virus with other respiratory manifestations: Secondary | ICD-10-CM | POA: Insufficient documentation

## 2023-08-16 DIAGNOSIS — J45909 Unspecified asthma, uncomplicated: Secondary | ICD-10-CM | POA: Insufficient documentation

## 2023-08-16 LAB — RESP PANEL BY RT-PCR (RSV, FLU A&B, COVID)  RVPGX2
Influenza A by PCR: POSITIVE — AB
Influenza B by PCR: NEGATIVE
Resp Syncytial Virus by PCR: NEGATIVE
SARS Coronavirus 2 by RT PCR: NEGATIVE

## 2023-08-16 MED ORDER — IBUPROFEN 800 MG PO TABS
800.0000 mg | ORAL_TABLET | Freq: Once | ORAL | Status: AC
  Administered 2023-08-17: 800 mg via ORAL
  Filled 2023-08-16: qty 1

## 2023-08-16 MED ORDER — DEXAMETHASONE SODIUM PHOSPHATE 10 MG/ML IJ SOLN
10.0000 mg | Freq: Once | INTRAMUSCULAR | Status: AC
  Administered 2023-08-17: 10 mg via INTRAMUSCULAR
  Filled 2023-08-16: qty 1

## 2023-08-16 NOTE — Discharge Instructions (Signed)
You were seen today for ongoing upper respiratory symptoms.  Continue your inhaler.  Make sure that you are staying hydrated.  Take Tylenol or ibuprofen as needed for fevers.  You tested positive for influenza A.

## 2023-08-16 NOTE — ED Provider Notes (Signed)
[place] EMERGENCY DEPARTMENT AT [facility] Provider Note   CSN: [id number] Arrival date & time: 08/16/23  1754     History  Chief Complaint  Patient presents with   Fever    Tonye Tancredi is a 48 y.o. female.  HPI     This is a 48 year old female who presents with concern for nausea, vomiting, diarrhea, cough and fever.  Onset of symptoms over a week ago.  However she developed fevers on Wednesday.  She reports cough and pain with coughing over the lower rib cage.  Has a history of asthma and has been using her inhaler.  Reports fevers over 101.  Home Medications Prior to Admission medications   Medication Sig Start Date End Date Taking? Authorizing Provider  albuterol (VENTOLIN HFA) 108 (90 Base) MCG/ACT inhaler Inhale 2 puffs into the lungs every 4 (four) hours as needed for wheezing or shortness of breath. 02/02/22   Terald Sleeper, MD  loratadine (CLARITIN) 10 MG tablet Take 10 mg by mouth daily.    [provider]      Allergies    Patient has no known allergies.    Review of Systems   Review of Systems  Constitutional:  Positive for fever.  Respiratory:  Positive for cough and shortness of breath.   Cardiovascular:  Positive for chest pain.  Gastrointestinal:  Positive for diarrhea, nausea and vomiting. Negative for abdominal pain.  All other systems reviewed and are negative.   Physical Exam Updated Vital Signs BP (!) 136/59 (BP Location: Right Arm)   Pulse (!) 104   Temp 99.2 F (37.3 C) (Oral)   Resp 20   SpO2 97%  Physical Exam Vitals and nursing note reviewed.  Constitutional:      Appearance: She is well-developed. She is obese. She is not ill-appearing.  HENT:     Head: Normocephalic and atraumatic.  Eyes:     Pupils: Pupils are equal, round, and reactive to light.  Cardiovascular:     Rate and Rhythm: Regular rhythm. Tachycardia present.     Heart sounds: Normal heart sounds.  Pulmonary:     Effort: Pulmonary  effort is normal. No respiratory distress.     Breath sounds: Wheezing present.     Comments: Occasional wheeze with coughing Abdominal:     Palpations: Abdomen is soft.  Musculoskeletal:     Cervical back: Neck supple.  Skin:    General: Skin is warm and dry.  Neurological:     Mental Status: She is alert and oriented to person, place, and time.  Psychiatric:        Mood and Affect: Mood normal.     ED Results / Procedures / Treatments   Labs (all labs ordered are listed, but only abnormal results are displayed) Labs Reviewed  RESP PANEL BY RT-PCR (RSV, FLU A&B, COVID)  RVPGX2 - Abnormal; Notable for the following components:      Result Value   Influenza A by PCR POSITIVE (*)    All other components within normal limits    EKG None  Radiology DG Chest 2 View Result Date: 08/16/2023 CLINICAL DATA:  Cough and fever. EXAM: CHEST - 2 VIEW COMPARISON:  06/23/2022 FINDINGS: The heart size and mediastinal contours are within normal limits. Both lungs are clear. The visualized skeletal structures are unremarkable. IMPRESSION: No active cardiopulmonary disease. Electronically Signed   By: Danae Orleans M.D.   On: 08/16/2023 19:14    Procedures Procedures    Medications Ordered  in ED Medications  dexamethasone (DECADRON) injection 10 mg (has no administration in time range)  ibuprofen (ADVIL) tablet 800 mg (has no administration in time range)    ED Course/ Medical Decision Making/ A&P                                 Medical Decision Making Amount and/or Complexity of Data Reviewed Radiology: ordered.  Risk Prescription drug management.   This patient presents to the ED for concern of cough, fever, vomiting, diarrhea, this involves an extensive number of treatment options, and is a complaint that carries with it a high risk of complications and morbidity.  I considered the following differential and admission for this acute, potentially life threatening condition.  The  differential diagnosis includes viral illness such as COVID or influenza, pneumonia, asthma exacerbation  MDM:    This is a 48 year old female who presents with upper respiratory symptoms.  She is overall nontoxic.  Afebrile.  Tachycardia to 104.  She is not in any respiratory distress.  She has occasional wheeze with coughing.  Positive influenza A.  Chest x-ray is negative for pneumonia.  She is out of the window for Tamiflu effectiveness.  Was given a dose of Decadron given wheezing and history of asthma.  Otherwise recommend supportive measures.  (Labs, imaging, consults)  Labs: I Ordered, and personally interpreted labs.  The pertinent results include: COVID, influenza  Imaging Studies ordered: I ordered imaging studies including x-ray I independently visualized and interpreted imaging. I agree with the radiologist interpretation  Additional history obtained from chart review.  External records from outside source obtained and reviewed including prior evaluations  Cardiac Monitoring: The patient was maintained on a cardiac monitor.  If on the cardiac monitor, I personally viewed and interpreted the cardiac monitored which showed an underlying rhythm of: Sinus  Reevaluation: After the interventions noted above, I reevaluated the patient and found that they have :stayed the same  Social Determinants of Health:  lives independently  Disposition: Discharge  Co morbidities that complicate the patient evaluation  Past Medical History:  Diagnosis Date   Asthma      Medicines Meds ordered this encounter  Medications   dexamethasone (DECADRON) injection 10 mg   ibuprofen (ADVIL) tablet 800 mg    I have reviewed the patients home medicines and have made adjustments as needed  Problem List / ED Course: Problem List Items Addressed This Visit   None Visit Diagnoses       Influenza A    -  Primary                   Final Clinical Impression(s) / ED  Diagnoses Final diagnoses:  Influenza A    Rx / DC Orders ED Discharge Orders     None         Shon Baton, MD 08/16/23 2355

## 2023-08-16 NOTE — ED Triage Notes (Signed)
C/o fever since Wednesday. Recent had "stomach bug" 2 weeks ago. Also endorses cough and lower back pain. Hx of asthma.

## 2023-10-03 IMAGING — DX DG CHEST 2V
3 series · 3 of 3 positions shown · non-contrast
Comparison: 08/23/2021

CLINICAL DATA: C/O cp, sob, non-smoker, hx covid. No hx ca. TKV

EXAM:
CHEST - 2 VIEW

[chest pa (1 of 2)]
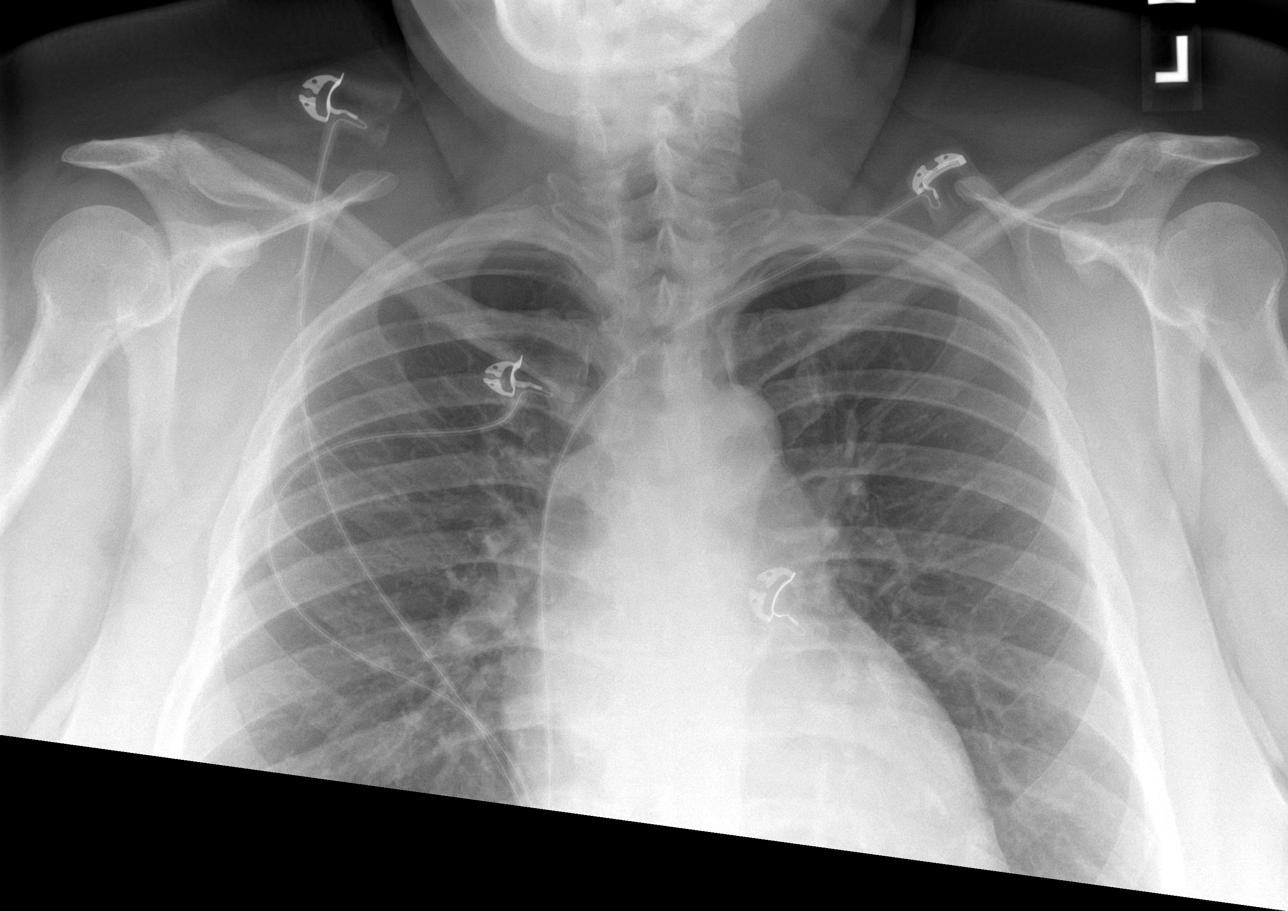

[chest pa (2 of 2)]
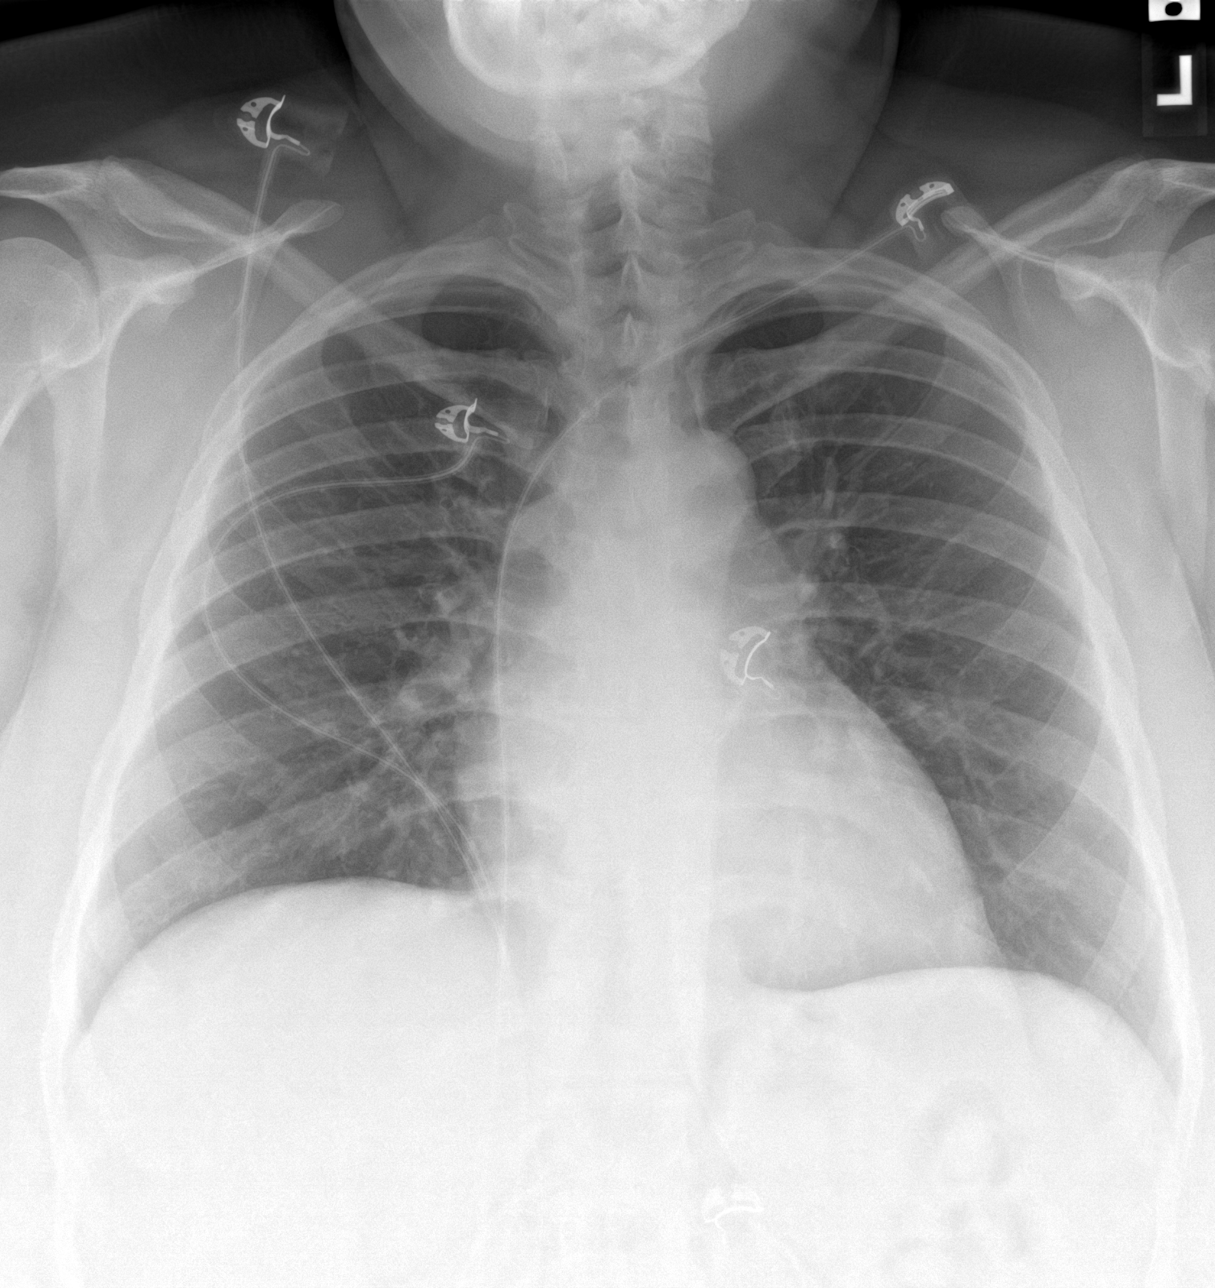

[chest lat]
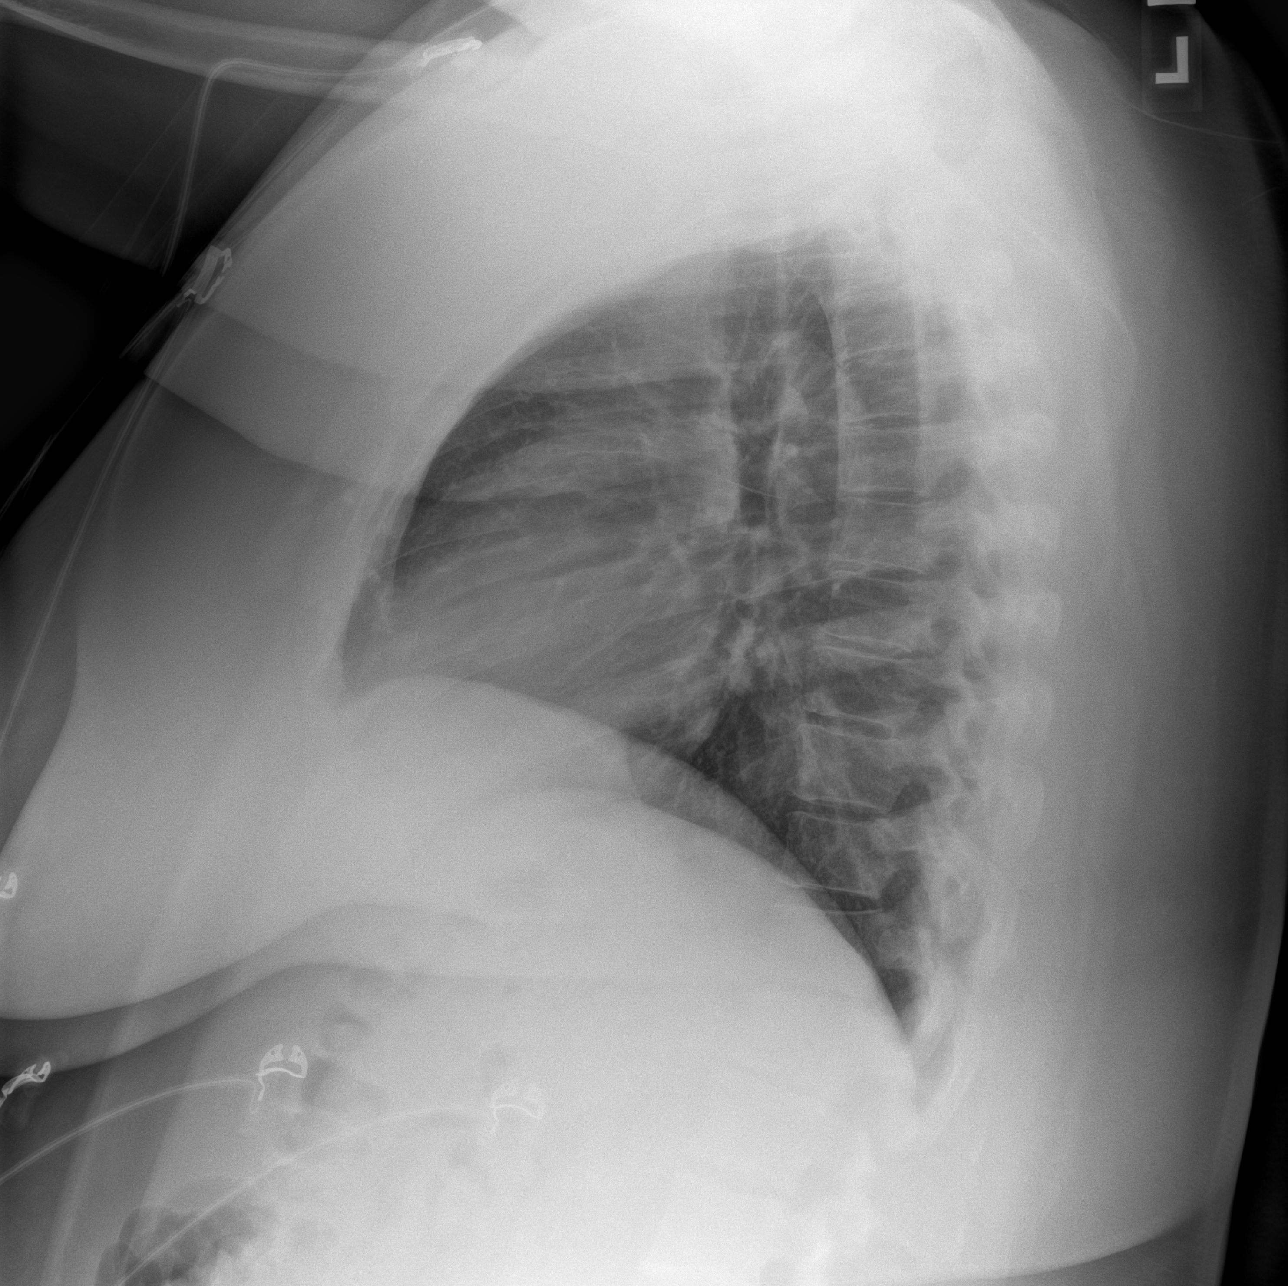

[3 of 3 positions shown; findings below may reference images not displayed]

FINDINGS: Lungs are clear.

Heart size and mediastinal contours are within normal limits.

No effusion.

Visualized bones unremarkable.
IMPRESSION: No acute cardiopulmonary disease.

## 2024-05-08 ENCOUNTER — Other Ambulatory Visit: Payer: Self-pay

## 2024-05-08 DIAGNOSIS — J209 Acute bronchitis, unspecified: Secondary | ICD-10-CM | POA: Insufficient documentation

## 2024-05-08 DIAGNOSIS — Z79899 Other long term (current) drug therapy: Secondary | ICD-10-CM | POA: Insufficient documentation

## 2024-05-08 DIAGNOSIS — Z7951 Long term (current) use of inhaled steroids: Secondary | ICD-10-CM | POA: Insufficient documentation

## 2024-05-08 DIAGNOSIS — J45909 Unspecified asthma, uncomplicated: Secondary | ICD-10-CM | POA: Diagnosis not present

## 2024-05-08 DIAGNOSIS — R059 Cough, unspecified: Secondary | ICD-10-CM | POA: Diagnosis present

## 2024-05-08 LAB — BASIC METABOLIC PANEL WITH GFR
Anion gap: 11 (ref 5–15)
BUN: 10 mg/dL (ref 6–20)
CO2: 25 mmol/L (ref 22–32)
Calcium: 9.1 mg/dL (ref 8.9–10.3)
Chloride: 102 mmol/L (ref 98–111)
Creatinine, Ser: 0.47 mg/dL (ref 0.44–1.00)
GFR, Estimated: >60 mL/min (ref >60)
Glucose, Bld: 98 mg/dL (ref 70–99)
Potassium: 3.9 mmol/L (ref 3.5–5.1)
Sodium: 138 mmol/L (ref 135–145)

## 2024-05-08 LAB — CBC
HCT: 35.1 % — ABNORMAL LOW (ref 36.0–46.0)
Hemoglobin: 11.6 g/dL — ABNORMAL LOW (ref 12.0–15.0)
MCH: 27.8 pg (ref 26.0–34.0)
MCHC: 33 g/dL (ref 30.0–36.0)
MCV: 84 fL (ref 80.0–100.0)
Platelets: 318 K/uL (ref 150–400)
RBC: 4.18 MIL/uL (ref 3.87–5.11)
RDW: 14.2 % (ref 11.5–15.5)
WBC: 11.3 K/uL — ABNORMAL HIGH (ref 4.0–10.5)
nRBC: 0 % (ref 0.0–0.2)

## 2024-05-08 NOTE — ED Notes (Signed)
 Pt seen in lobby by this RT. Dim BLB throughout.

## 2024-05-08 NOTE — ED Triage Notes (Signed)
 Pt POV reporting persistent SOB and cough x1 week, seen at [facility] twice, covid and flu negative.

## 2024-05-09 ENCOUNTER — Emergency Department (HOSPITAL_BASED_OUTPATIENT_CLINIC_OR_DEPARTMENT_OTHER): Admitting: Radiology

## 2024-05-09 ENCOUNTER — Emergency Department (HOSPITAL_BASED_OUTPATIENT_CLINIC_OR_DEPARTMENT_OTHER)
Admission: EM | Admit: 2024-05-09 | Discharge: 2024-05-09 | Disposition: A | Discharge status: Home / Self Care | Attending: Emergency Medicine | Admitting: Emergency Medicine

## 2024-05-09 DIAGNOSIS — J209 Acute bronchitis, unspecified: Secondary | ICD-10-CM

## 2024-05-09 MED ORDER — IPRATROPIUM-ALBUTEROL 0.5-2.5 (3) MG/3ML IN SOLN
3.0000 mL | Freq: Once | RESPIRATORY_TRACT | Status: AC
  Administered 2024-05-09: 3 mL via RESPIRATORY_TRACT
  Filled 2024-05-09: qty 3

## 2024-05-09 MED ORDER — AEROCHAMBER PLUS FLO-VU MEDIUM MISC
1.0000 | Freq: Once | Status: AC
  Administered 2024-05-09: 1

## 2024-05-09 MED ORDER — DEXAMETHASONE SOD PHOSPHATE PF 10 MG/ML IJ SOLN: 10.0000 mg | Freq: Once | INTRAMUSCULAR | Status: DC

## 2024-05-09 MED ORDER — ALBUTEROL SULFATE HFA 108 (90 BASE) MCG/ACT IN AERS
2.0000 | INHALATION_SPRAY | Freq: Once | RESPIRATORY_TRACT | Status: AC
  Administered 2024-05-09: 2 via RESPIRATORY_TRACT
  Filled 2024-05-09: qty 6.7

## 2024-05-09 MED ORDER — DEXAMETHASONE SOD PHOSPHATE PF 10 MG/ML IJ SOLN
10.0000 mg | Freq: Once | INTRAMUSCULAR | Status: AC
  Administered 2024-05-09: 10 mg via INTRAMUSCULAR

## 2024-05-09 NOTE — Discharge Instructions (Signed)
 You were seen today for cough.  You likely have some bronchitis.  Use the inhaler as needed.  The steroid should take effect and help with inflammation.

## 2024-05-09 NOTE — ED Provider Notes (Signed)
 [place] EMERGENCY DEPARTMENT AT [facility] Provider Note   CSN: [id number] Arrival date & time: 05/08/24  2248     Patient presents with: Shortness of Breath   Rachel Soto is a 48 y.o. female.   HPI     This a 48 year old female who presents with cough.  Patient reports 1 week history of cough.  She states that it is keeping her awake at night.  She was seen in urgent care and tested negative for COVID and flu.  She has had some shortness of breath.  Reports remote history of asthma and has been using her inhaler but does not feel that she has improved.  Cough is nonproductive.  No fevers.  Prior to Admission medications   Medication Sig Start Date End Date Taking? Authorizing Provider  albuterol  (VENTOLIN  HFA) 108 (90 Base) MCG/ACT inhaler Inhale 2 puffs into the lungs every 4 (four) hours as needed for wheezing or shortness of breath. 02/02/22   Cottie Donnice PARAS, MD  loratadine (CLARITIN) 10 MG tablet Take 10 mg by mouth daily.    [provider]    Allergies: Patient has no known allergies.    Review of Systems  Constitutional:  Negative for fever.  Respiratory:  Positive for cough and shortness of breath.   Cardiovascular:  Negative for chest pain.  All other systems reviewed and are negative.   Updated Vital Signs BP (!) 114/55   Pulse 65   Temp 97.8 F (36.6 C) (Temporal)   Resp 20   Ht 1.6 m (5' 3)   Wt (!) 142.4 kg   SpO2 93%   BMI 55.62 kg/m   Physical Exam Vitals and nursing note reviewed.  Constitutional:      Appearance: She is well-developed. She is obese. She is not ill-appearing.     Comments: Occasional cough  HENT:     Head: Normocephalic and atraumatic.  Eyes:     Pupils: Pupils are equal, round, and reactive to light.  Cardiovascular:     Rate and Rhythm: Normal rate and regular rhythm.     Heart sounds: Normal heart sounds.  Pulmonary:     Effort: Pulmonary effort is normal. No respiratory distress.      Breath sounds: Wheezing present.     Comments: Fair air movement, occasional wheezing with cough only, otherwise clear Abdominal:     General: Bowel sounds are normal.     Palpations: Abdomen is soft.  Musculoskeletal:     Cervical back: Neck supple.  Skin:    General: Skin is warm and dry.  Neurological:     Mental Status: She is alert and oriented to person, place, and time.  Psychiatric:        Mood and Affect: Mood normal.     (all labs ordered are listed, but only abnormal results are displayed) Labs Reviewed  CBC - Abnormal; Notable for the following components:      Result Value   WBC 11.3 (*)    Hemoglobin 11.6 (*)    HCT 35.1 (*)    All other components within normal limits  BASIC METABOLIC PANEL WITH GFR  PREGNANCY, URINE    EKG: EKG Interpretation Date/Time:  Friday May 08 2024 23:01:05 EST Ventricular Rate:  81 PR Interval:  134 QRS Duration:  92 QT Interval:  373 QTC Calculation: 433 R Axis:   78  Text Interpretation: Sinus rhythm Confirmed by Bari Pfeiffer ([id number]) on 05/09/2024 12:55:43 AM  Radiology: [facility] Chest 2 View  Result Date: 05/09/2024 EXAM: 2 VIEW(S) XRAY OF THE CHEST 05/09/2024 12:47:00 AM COMPARISON: 08/16/2023 CLINICAL HISTORY: SOB FINDINGS: LUNGS AND PLEURA: No focal pulmonary opacity. No pleural effusion. No pneumothorax. HEART AND MEDIASTINUM: No acute abnormality of the cardiac and mediastinal silhouettes. BONES AND SOFT TISSUES: No acute osseous abnormality. IMPRESSION: 1. No acute process. Electronically signed by: Dorethia Molt MD 05/09/2024 12:56 AM EST RP Workstation: [id number]     Procedures   Medications Ordered in the ED  albuterol  (VENTOLIN  HFA) 108 (90 Base) MCG/ACT inhaler 2 puff (has no administration in time range)  AeroChamber Plus Flo-Vu Medium MISC 1 each (has no administration in time range)  ipratropium-albuterol  (DUONEB) 0.5-2.5 (3) MG/3ML nebulizer solution 3 mL (3 mLs Nebulization Given 05/09/24 0212)   dexamethasone  (DECADRON ) injection 10 mg (10 mg Intramuscular Given 05/09/24 0129)                                    Medical Decision Making Amount and/or Complexity of Data Reviewed Labs: ordered. Radiology: ordered.  Risk Prescription drug management.   This patient presents to the ED for concern of cough, this involves an extensive number of treatment options, and is a complaint that carries with it a high risk of complications and morbidity.  I considered the following differential and admission for this acute, potentially life threatening condition.  The differential diagnosis includes pneumonia, bronchitis, asthma, viral illness  MDM:    This is a 48 year old female who presents with cough primarily.  She is nontoxic.  Vital signs are reassuring.  She is not hypoxic.  Only has wheezing with coughing.  Given her history of asthma, suspect some bronchospasm.  She was given a dose of Decadron  and a DuoNeb.  Labs obtained and reviewed and largely reassuring.  Prior COVID and flu negative.  She was not retested.  Chest x-ray does not show any evidence of obvious pneumonia.  On recheck, she states that she feels better after the DuoNeb.  She is in no respiratory distress.  Will discharge home.  She was provided with an inhaler and a spacer at discharge.  Suspect an element of acute bronchitis.  (Labs, imaging, consults)  Labs: I Ordered, and personally interpreted labs.  The pertinent results include: CBC, BMP  Imaging Studies ordered: I ordered imaging studies including chest x-ray I independently visualized and interpreted imaging. I agree with the radiologist interpretation  Additional history obtained from chart review.  External records from outside source obtained and reviewed including prior evaluations  Cardiac Monitoring: The patient was maintained on a cardiac monitor.  If on the cardiac monitor, I personally viewed and interpreted the cardiac monitored which showed an  underlying rhythm of: Sinus  Reevaluation: After the interventions noted above, I reevaluated the patient and found that they have :improved  Social Determinants of Health:  lives independently  Disposition: Discharge  Co morbidities that complicate the patient evaluation  Past Medical History:  Diagnosis Date   Asthma      Medicines Meds ordered this encounter  Medications   DISCONTD: dexamethasone  (DECADRON ) injection 10 mg   ipratropium-albuterol  (DUONEB) 0.5-2.5 (3) MG/3ML nebulizer solution 3 mL   dexamethasone  (DECADRON ) injection 10 mg   albuterol  (VENTOLIN  HFA) 108 (90 Base) MCG/ACT inhaler 2 puff   AeroChamber Plus Flo-Vu Medium MISC 1 each    I have reviewed the patients home medicines and have made adjustments as needed  Problem List /  ED Course: Problem List Items Addressed This Visit   None Visit Diagnoses       Acute bronchitis, unspecified organism    -  Primary                Final diagnoses:  Acute bronchitis, unspecified organism    ED Discharge Orders     None          Bari Charmaine FALCON, MD 05/09/24 [phone]

## 2024-05-17 ENCOUNTER — Emergency Department (HOSPITAL_BASED_OUTPATIENT_CLINIC_OR_DEPARTMENT_OTHER)
Admission: EM | Admit: 2024-05-17 | Discharge: 2024-05-17 | Disposition: A | Discharge status: Home / Self Care | Attending: Emergency Medicine | Admitting: Emergency Medicine

## 2024-05-17 ENCOUNTER — Other Ambulatory Visit: Payer: Self-pay

## 2024-05-17 DIAGNOSIS — J40 Bronchitis, not specified as acute or chronic: Secondary | ICD-10-CM | POA: Diagnosis not present

## 2024-05-17 DIAGNOSIS — R051 Acute cough: Secondary | ICD-10-CM

## 2024-05-17 DIAGNOSIS — R059 Cough, unspecified: Secondary | ICD-10-CM | POA: Diagnosis present

## 2024-05-17 MED ORDER — DEXAMETHASONE SOD PHOSPHATE PF 10 MG/ML IJ SOLN
10.0000 mg | Freq: Once | INTRAMUSCULAR | Status: AC
  Administered 2024-05-17: 10 mg via INTRAMUSCULAR

## 2024-05-17 NOTE — Discharge Instructions (Signed)
 You were seen today for ongoing cough.  This is likely related to ongoing bronchitis.  Cough can last 4 to 6 weeks.  Continue albuterol  and promethazine.  Follow-up with primary doctor if not improving.  If you develop worsening shortness of breath or fevers, you should be reevaluated.

## 2024-05-17 NOTE — ED Provider Notes (Signed)
 [place] EMERGENCY DEPARTMENT AT [facility] Provider Note   CSN: [id number] Arrival date & time: 05/17/24  2246     Patient presents with: No chief complaint on file.   Ethelda Deangelo is a 48 y.o. female.   HPI     This is a 48 year old female with a history of asthma who presents with ongoing cough.  Patient was seen and evaluated 1 week ago for the same.  She has had some upper respiratory illness.  Tested negative for COVID and flu.  Had a negative chest x-ray 1 week ago.  Has had persistent cough.  She states the cough is keeping her from sleeping.  It is worse in the morning.  She is using albuterol  and promethazine with limited improvement.  She has not had any ongoing fevers.  Reports some chest discomfort with coughing.  Prior to Admission medications   Medication Sig Start Date End Date Taking? Authorizing Provider  albuterol  (VENTOLIN  HFA) 108 (90 Base) MCG/ACT inhaler Inhale 2 puffs into the lungs every 4 (four) hours as needed for wheezing or shortness of breath. 02/02/22   Cottie Donnice PARAS, MD  loratadine (CLARITIN) 10 MG tablet Take 10 mg by mouth daily.    [provider]    Allergies: Patient has no known allergies.    Review of Systems  Constitutional:  Negative for fever.  Respiratory:  Positive for cough. Negative for shortness of breath.   Cardiovascular:  Negative for chest pain.  All other systems reviewed and are negative.   Updated Vital Signs BP (!) 174/95   Pulse 79   Temp 98.1 F (36.7 C) (Oral)   Resp (!) 26   SpO2 100%   Physical Exam Vitals and nursing note reviewed.  Constitutional:      Appearance: She is well-developed. She is obese. She is not ill-appearing.  HENT:     Head: Normocephalic and atraumatic.  Eyes:     Pupils: Pupils are equal, round, and reactive to light.  Cardiovascular:     Rate and Rhythm: Normal rate and regular rhythm.     Heart sounds: Normal heart sounds.  Pulmonary:     Effort:  Pulmonary effort is normal. No respiratory distress.     Breath sounds: No wheezing.     Comments: Occasional cough, no active wheezing Chest:     Chest wall: No tenderness.  Abdominal:     Palpations: Abdomen is soft.     Tenderness: There is no abdominal tenderness. There is no guarding or rebound.  Musculoskeletal:     Cervical back: Neck supple.  Skin:    General: Skin is warm and dry.  Neurological:     Mental Status: She is alert and oriented to person, place, and time.  Psychiatric:        Mood and Affect: Mood normal.     (all labs ordered are listed, but only abnormal results are displayed) Labs Reviewed - No data to display  EKG: None  Radiology: No results found.   Procedures   Medications Ordered in the ED  dexamethasone  (DECADRON ) injection 10 mg (has no administration in time range)                                    Medical Decision Making Risk Prescription drug management.   This patient presents to the ED for concern of ongoing cough, this involves an extensive number of  treatment options, and is a complaint that carries with it a high risk of complications and morbidity.  I considered the following differential and admission for this acute, potentially life threatening condition.  The differential diagnosis includes bronchitis, bronchospasm, viral illness, pneumonia  MDM:    This is a 48 year old female who presents with cough.  This, sleeping.  She is nontoxic and vital signs reassuring.  She is satting 100% on room air.  No active wheezing on exam.  In the absence of ongoing fever, would doubt pneumonia.  Suspect she may have subacute bronchitis type picture with ongoing cough.  She did report some improvement with Decadron  last week.  I have offered a second dose of Decadron  for airway inflammation.  I have advised her that cough from bronchitis type picture can last 4 to 6 weeks.  She needs to ongoing supportive measures.  She is not hypoxic or any  respiratory distress.  (Labs, imaging, consults)  Labs: I Ordered, and personally interpreted labs.  The pertinent results include: None  Imaging Studies ordered: I ordered imaging studies including none I independently visualized and interpreted imaging. I agree with the radiologist interpretation  Additional history obtained from chart review.  External records from outside source obtained and reviewed including prior evaluations  Cardiac Monitoring: The patient was maintained on a cardiac monitor.  If on the cardiac monitor, I personally viewed and interpreted the cardiac monitored which showed an underlying rhythm of: Sinus  Reevaluation: After the interventions noted above, I reevaluated the patient and found that they have :stayed the same  Social Determinants of Health:  lives independently  Disposition: Discharge  Co morbidities that complicate the patient evaluation  Past Medical History:  Diagnosis Date   Asthma      Medicines Meds ordered this encounter  Medications   dexamethasone  (DECADRON ) injection 10 mg    I have reviewed the patients home medicines and have made adjustments as needed  Problem List / ED Course: Problem List Items Addressed This Visit   None Visit Diagnoses       Acute cough    -  Primary     Bronchitis                    Final diagnoses:  Acute cough  Bronchitis    ED Discharge Orders     None          Bari Charmaine FALCON, MD 05/17/24 2325

## 2024-05-17 NOTE — ED Triage Notes (Signed)
 Here for similar symptoms of previous visit. Asthma. Coughing- worse with laying down. Difficulty sleeping. Using inhaler q4hr with minimal relief. Productive cough in the morning. Ambulatory to room NAD.

## 2024-05-17 NOTE — ED Notes (Signed)
 Patient ambulated around ER with pulse ox. SpO2 97%. EDP aware.
